# Patient Record
Sex: Female | Born: 1955 | Race: White | Hispanic: No | Marital: Married | Smoking: Never smoker
Health system: Southern US, Community
[De-identification: ages and names within clinical notes are randomized; demographics above are authoritative.]

## PROBLEM LIST (undated history)

## (undated) DIAGNOSIS — R112 Nausea with vomiting, unspecified: Secondary | ICD-10-CM

## (undated) DIAGNOSIS — B059 Measles without complication: Secondary | ICD-10-CM

## (undated) DIAGNOSIS — B029 Zoster without complications: Secondary | ICD-10-CM

## (undated) DIAGNOSIS — K648 Other hemorrhoids: Secondary | ICD-10-CM

## (undated) DIAGNOSIS — B069 Rubella without complication: Secondary | ICD-10-CM

## (undated) DIAGNOSIS — Z124 Encounter for screening for malignant neoplasm of cervix: Principal | ICD-10-CM

## (undated) DIAGNOSIS — D649 Anemia, unspecified: Secondary | ICD-10-CM

## (undated) DIAGNOSIS — F419 Anxiety disorder, unspecified: Secondary | ICD-10-CM

## (undated) DIAGNOSIS — B019 Varicella without complication: Secondary | ICD-10-CM

## (undated) DIAGNOSIS — K222 Esophageal obstruction: Secondary | ICD-10-CM

## (undated) DIAGNOSIS — IMO0002 Reserved for concepts with insufficient information to code with codable children: Secondary | ICD-10-CM

## (undated) DIAGNOSIS — B269 Mumps without complication: Secondary | ICD-10-CM

## (undated) DIAGNOSIS — E079 Disorder of thyroid, unspecified: Secondary | ICD-10-CM

## (undated) DIAGNOSIS — K219 Gastro-esophageal reflux disease without esophagitis: Secondary | ICD-10-CM

## (undated) DIAGNOSIS — Z9889 Other specified postprocedural states: Secondary | ICD-10-CM

## (undated) DIAGNOSIS — K5790 Diverticulosis of intestine, part unspecified, without perforation or abscess without bleeding: Secondary | ICD-10-CM

## (undated) DIAGNOSIS — F418 Other specified anxiety disorders: Secondary | ICD-10-CM

## (undated) DIAGNOSIS — J019 Acute sinusitis, unspecified: Secondary | ICD-10-CM

## (undated) DIAGNOSIS — K449 Diaphragmatic hernia without obstruction or gangrene: Secondary | ICD-10-CM

## (undated) DIAGNOSIS — K635 Polyp of colon: Secondary | ICD-10-CM

## (undated) DIAGNOSIS — T7840XA Allergy, unspecified, initial encounter: Secondary | ICD-10-CM

## (undated) DIAGNOSIS — M1990 Unspecified osteoarthritis, unspecified site: Secondary | ICD-10-CM

## (undated) DIAGNOSIS — M549 Dorsalgia, unspecified: Secondary | ICD-10-CM

## (undated) DIAGNOSIS — E785 Hyperlipidemia, unspecified: Secondary | ICD-10-CM

## (undated) HISTORY — PX: UPPER GASTROINTESTINAL ENDOSCOPY: SHX188

## (undated) HISTORY — DX: Dorsalgia, unspecified: M54.9

## (undated) HISTORY — DX: Diverticulosis of intestine, part unspecified, without perforation or abscess without bleeding: K57.90

## (undated) HISTORY — DX: Hyperlipidemia, unspecified: E78.5

## (undated) HISTORY — DX: Reserved for concepts with insufficient information to code with codable children: IMO0002

## (undated) HISTORY — DX: Anemia, unspecified: D64.9

## (undated) HISTORY — DX: Esophageal obstruction: K22.2

## (undated) HISTORY — DX: Other specified postprocedural states: R11.2

## (undated) HISTORY — DX: Mumps without complication: B26.9

## (undated) HISTORY — DX: Measles without complication: B05.9

## (undated) HISTORY — PX: POLYPECTOMY: SHX149

## (undated) HISTORY — DX: Encounter for screening for malignant neoplasm of cervix: Z12.4

## (undated) HISTORY — DX: Zoster without complications: B02.9

## (undated) HISTORY — DX: Allergy, unspecified, initial encounter: T78.40XA

## (undated) HISTORY — DX: Disorder of thyroid, unspecified: E07.9

## (undated) HISTORY — DX: Varicella without complication: B01.9

## (undated) HISTORY — DX: Other specified anxiety disorders: F41.8

## (undated) HISTORY — DX: Anxiety disorder, unspecified: F41.9

## (undated) HISTORY — DX: Diaphragmatic hernia without obstruction or gangrene: K44.9

## (undated) HISTORY — PX: COLONOSCOPY: SHX174

## (undated) HISTORY — DX: Other specified postprocedural states: Z98.890

## (undated) HISTORY — DX: Polyp of colon: K63.5

## (undated) HISTORY — DX: Gastro-esophageal reflux disease without esophagitis: K21.9

## (undated) HISTORY — PX: WISDOM TOOTH EXTRACTION: SHX21

## (undated) HISTORY — DX: Other hemorrhoids: K64.8

## (undated) HISTORY — PX: VAGINAL HYSTERECTOMY: SUR661

## (undated) HISTORY — DX: Unspecified osteoarthritis, unspecified site: M19.90

## (undated) HISTORY — DX: Rubella without complication: B06.9

## (undated) HISTORY — DX: Acute sinusitis, unspecified: J01.90

---

## 1998-07-05 ENCOUNTER — Emergency Department (HOSPITAL_COMMUNITY): Admission: EM | Admit: 1998-07-05 | Discharge: 1998-07-05 | Payer: Self-pay | Admitting: Emergency Medicine

## 1998-10-27 ENCOUNTER — Inpatient Hospital Stay (HOSPITAL_COMMUNITY): Admission: RE | Admit: 1998-10-27 | Discharge: 1998-10-28 | Payer: Self-pay | Admitting: Obstetrics and Gynecology

## 1998-10-27 ENCOUNTER — Encounter (INDEPENDENT_AMBULATORY_CARE_PROVIDER_SITE_OTHER): Payer: Self-pay | Admitting: Specialist

## 1999-10-13 ENCOUNTER — Encounter: Admission: RE | Admit: 1999-10-13 | Discharge: 1999-10-13 | Payer: Self-pay | Admitting: Obstetrics and Gynecology

## 1999-10-13 ENCOUNTER — Encounter: Payer: Self-pay | Admitting: Obstetrics and Gynecology

## 1999-10-19 ENCOUNTER — Encounter: Payer: Self-pay | Admitting: Obstetrics and Gynecology

## 1999-10-19 ENCOUNTER — Encounter: Admission: RE | Admit: 1999-10-19 | Discharge: 1999-10-19 | Payer: Self-pay | Admitting: Obstetrics and Gynecology

## 2000-11-03 ENCOUNTER — Encounter: Admission: RE | Admit: 2000-11-03 | Discharge: 2000-11-03 | Payer: Self-pay | Admitting: Obstetrics and Gynecology

## 2000-11-03 ENCOUNTER — Encounter: Payer: Self-pay | Admitting: Obstetrics and Gynecology

## 2000-12-06 ENCOUNTER — Other Ambulatory Visit: Admission: RE | Admit: 2000-12-06 | Discharge: 2000-12-06 | Payer: Self-pay | Admitting: Obstetrics and Gynecology

## 2001-05-28 ENCOUNTER — Encounter: Payer: Self-pay | Admitting: Internal Medicine

## 2001-06-19 ENCOUNTER — Encounter: Payer: Self-pay | Admitting: Internal Medicine

## 2001-08-21 ENCOUNTER — Ambulatory Visit (HOSPITAL_COMMUNITY): Admission: RE | Admit: 2001-08-21 | Discharge: 2001-08-21 | Payer: Self-pay | Admitting: Internal Medicine

## 2001-08-21 ENCOUNTER — Encounter: Payer: Self-pay | Admitting: Internal Medicine

## 2002-08-15 ENCOUNTER — Encounter: Payer: Self-pay | Admitting: Internal Medicine

## 2002-12-30 ENCOUNTER — Encounter: Admission: RE | Admit: 2002-12-30 | Discharge: 2002-12-30 | Payer: Self-pay | Admitting: Obstetrics and Gynecology

## 2002-12-31 ENCOUNTER — Encounter: Admission: RE | Admit: 2002-12-31 | Discharge: 2003-03-31 | Payer: Self-pay | Admitting: *Deleted

## 2003-11-26 ENCOUNTER — Ambulatory Visit: Payer: Self-pay | Admitting: Internal Medicine

## 2004-01-15 ENCOUNTER — Encounter: Admission: RE | Admit: 2004-01-15 | Discharge: 2004-01-15 | Payer: Self-pay | Admitting: Obstetrics and Gynecology

## 2004-04-13 ENCOUNTER — Encounter: Admission: RE | Admit: 2004-04-13 | Discharge: 2004-04-13 | Payer: Self-pay | Admitting: Podiatry

## 2004-10-18 ENCOUNTER — Ambulatory Visit: Payer: Self-pay | Admitting: Endocrinology

## 2004-10-18 ENCOUNTER — Ambulatory Visit: Payer: Self-pay | Admitting: Internal Medicine

## 2004-11-12 ENCOUNTER — Ambulatory Visit (HOSPITAL_COMMUNITY): Admission: RE | Admit: 2004-11-12 | Discharge: 2004-11-12 | Payer: Self-pay | Admitting: Internal Medicine

## 2004-11-12 ENCOUNTER — Ambulatory Visit: Payer: Self-pay | Admitting: Internal Medicine

## 2004-12-01 ENCOUNTER — Ambulatory Visit: Payer: Self-pay | Admitting: Endocrinology

## 2004-12-13 ENCOUNTER — Ambulatory Visit (HOSPITAL_COMMUNITY): Admission: RE | Admit: 2004-12-13 | Discharge: 2004-12-13 | Payer: Self-pay | Admitting: Endocrinology

## 2004-12-28 ENCOUNTER — Ambulatory Visit: Payer: Self-pay | Admitting: Endocrinology

## 2005-03-02 ENCOUNTER — Encounter: Admission: RE | Admit: 2005-03-02 | Discharge: 2005-03-02 | Payer: Self-pay | Admitting: Obstetrics and Gynecology

## 2005-12-07 ENCOUNTER — Ambulatory Visit: Payer: Self-pay | Admitting: Endocrinology

## 2005-12-07 LAB — CONVERTED CEMR LAB
ALT: 13 units/L (ref 0–40)
AST: 22 units/L (ref 0–37)
Albumin: 3.8 g/dL (ref 3.5–5.2)
Alkaline Phosphatase: 86 units/L (ref 39–117)
BUN: 12 mg/dL (ref 6–23)
Basophils Absolute: 0 10*3/uL (ref 0.0–0.1)
Basophils Relative: 1 % (ref 0.0–1.0)
Bilirubin Urine: NEGATIVE
CO2: 26 meq/L (ref 19–32)
Calcium: 9.1 mg/dL (ref 8.4–10.5)
Chloride: 101 meq/L (ref 96–112)
Chol/HDL Ratio, serum: 2.7
Cholesterol: 199 mg/dL (ref 0–200)
Creatinine, Ser: 0.8 mg/dL (ref 0.4–1.2)
Eosinophil percent: 6.5 % — ABNORMAL HIGH (ref 0.0–5.0)
GFR calc non Af Amer: 80 mL/min
Glomerular Filtration Rate, Af Am: 97 mL/min/{1.73_m2}
Glucose, Bld: 111 mg/dL — ABNORMAL HIGH (ref 70–99)
HCT: 34.9 % — ABNORMAL LOW (ref 36.0–46.0)
HDL: 73.4 mg/dL (ref >39.0)
Hemoglobin, Urine: NEGATIVE
Hemoglobin: 11.3 g/dL — ABNORMAL LOW (ref 12.0–15.0)
Hgb A1c MFr Bld: 5.4 % (ref 4.6–6.0)
Iron: 49 ug/dL (ref 42–145)
Ketones, ur: NEGATIVE mg/dL
LDL Cholesterol: 106 mg/dL — ABNORMAL HIGH (ref 0–99)
Leukocytes, UA: NEGATIVE
Lymphocytes Relative: 41.7 % (ref 12.0–46.0)
MCHC: 32.4 g/dL (ref 30.0–36.0)
MCV: 88.9 fL (ref 78.0–100.0)
Monocytes Absolute: 0.3 10*3/uL (ref 0.2–0.7)
Monocytes Relative: 6.8 % (ref 3.0–11.0)
Neutro Abs: 2 10*3/uL (ref 1.4–7.7)
Neutrophils Relative %: 44 % (ref 43.0–77.0)
Nitrite: NEGATIVE
Platelets: 274 10*3/uL (ref 150–400)
Potassium: 3.7 meq/L (ref 3.5–5.1)
RBC: 3.93 M/uL (ref 3.87–5.11)
RDW: 11.9 % (ref 11.5–14.6)
Saturation Ratios: 13.7 % — ABNORMAL LOW (ref 20.0–50.0)
Sodium: 135 meq/L (ref 135–145)
Specific Gravity, Urine: <=1.005 (ref 1.000–1.03)
TSH: 1.67 microintl units/mL (ref 0.35–5.50)
Total Bilirubin: 0.5 mg/dL (ref 0.3–1.2)
Total Protein, Urine: NEGATIVE mg/dL
Total Protein: 6.5 g/dL (ref 6.0–8.3)
Transferrin: 254.7 mg/dL (ref >=212.0)
Triglyceride fasting, serum: 98 mg/dL (ref 0–149)
Urine Glucose: NEGATIVE mg/dL
Urobilinogen, UA: 0.2 (ref 0.0–1.0)
VLDL: 20 mg/dL (ref 0–40)
Vitamin B-12: 277 pg/mL (ref 211–911)
WBC: 4.4 10*3/uL — ABNORMAL LOW (ref 4.5–10.5)
pH: 6 (ref 5.0–8.0)

## 2005-12-14 ENCOUNTER — Encounter: Admission: RE | Admit: 2005-12-14 | Discharge: 2005-12-14 | Payer: Self-pay | Admitting: Neurosurgery

## 2006-02-08 ENCOUNTER — Ambulatory Visit: Payer: Self-pay | Admitting: Internal Medicine

## 2006-02-16 ENCOUNTER — Encounter (INDEPENDENT_AMBULATORY_CARE_PROVIDER_SITE_OTHER): Payer: Self-pay | Admitting: *Deleted

## 2006-02-16 ENCOUNTER — Ambulatory Visit: Payer: Self-pay | Admitting: Internal Medicine

## 2006-03-03 ENCOUNTER — Encounter: Admission: RE | Admit: 2006-03-03 | Discharge: 2006-03-03 | Payer: Self-pay | Admitting: Obstetrics and Gynecology

## 2006-10-05 ENCOUNTER — Encounter: Admission: RE | Admit: 2006-10-05 | Discharge: 2006-10-05 | Payer: Self-pay | Admitting: Obstetrics and Gynecology

## 2006-10-21 ENCOUNTER — Encounter: Payer: Self-pay | Admitting: *Deleted

## 2006-10-21 DIAGNOSIS — E039 Hypothyroidism, unspecified: Secondary | ICD-10-CM | POA: Insufficient documentation

## 2006-10-21 DIAGNOSIS — K219 Gastro-esophageal reflux disease without esophagitis: Secondary | ICD-10-CM | POA: Insufficient documentation

## 2006-10-21 DIAGNOSIS — E785 Hyperlipidemia, unspecified: Secondary | ICD-10-CM | POA: Insufficient documentation

## 2007-01-04 ENCOUNTER — Encounter: Payer: Self-pay | Admitting: Endocrinology

## 2007-01-08 LAB — CONVERTED CEMR LAB: Pap Smear: NORMAL

## 2007-03-06 ENCOUNTER — Encounter: Admission: RE | Admit: 2007-03-06 | Discharge: 2007-03-06 | Payer: Self-pay | Admitting: Obstetrics and Gynecology

## 2007-05-04 ENCOUNTER — Ambulatory Visit: Payer: Self-pay | Admitting: Internal Medicine

## 2007-05-04 DIAGNOSIS — H8110 Benign paroxysmal vertigo, unspecified ear: Secondary | ICD-10-CM | POA: Insufficient documentation

## 2007-05-08 ENCOUNTER — Ambulatory Visit: Payer: Self-pay | Admitting: Endocrinology

## 2007-05-09 LAB — CONVERTED CEMR LAB
ALT: 17 units/L (ref 0–35)
AST: 22 units/L (ref 0–37)
Albumin: 3.9 g/dL (ref 3.5–5.2)
Alkaline Phosphatase: 71 units/L (ref 39–117)
BUN: 13 mg/dL (ref 6–23)
Bilirubin, Direct: 0.1 mg/dL (ref 0.0–0.3)
CO2: 31 meq/L (ref 19–32)
Calcium: 9.1 mg/dL (ref 8.4–10.5)
Chloride: 108 meq/L (ref 96–112)
Cholesterol: 188 mg/dL (ref 0–200)
Creatinine, Ser: 0.9 mg/dL (ref 0.4–1.2)
GFR calc Af Amer: 85 mL/min
GFR calc non Af Amer: 70 mL/min
Glucose, Bld: 103 mg/dL — ABNORMAL HIGH (ref 70–99)
HDL: 69.2 mg/dL (ref >39.0)
Hgb A1c MFr Bld: 5.6 % (ref 4.6–6.0)
LDL Cholesterol: 101 mg/dL — ABNORMAL HIGH (ref 0–99)
Potassium: 4.3 meq/L (ref 3.5–5.1)
Sodium: 143 meq/L (ref 135–145)
TSH: 2.43 microintl units/mL (ref 0.35–5.50)
Total Bilirubin: 0.6 mg/dL (ref 0.3–1.2)
Total CHOL/HDL Ratio: 2.7
Total Protein: 6.7 g/dL (ref 6.0–8.3)
Triglycerides: 91 mg/dL (ref 0–149)
VLDL: 18 mg/dL (ref 0–40)

## 2007-05-11 ENCOUNTER — Telehealth (INDEPENDENT_AMBULATORY_CARE_PROVIDER_SITE_OTHER): Payer: Self-pay | Admitting: *Deleted

## 2007-05-15 ENCOUNTER — Ambulatory Visit: Payer: Self-pay | Admitting: Endocrinology

## 2007-05-15 DIAGNOSIS — R739 Hyperglycemia, unspecified: Secondary | ICD-10-CM | POA: Insufficient documentation

## 2007-05-17 DIAGNOSIS — K5730 Diverticulosis of large intestine without perforation or abscess without bleeding: Secondary | ICD-10-CM | POA: Insufficient documentation

## 2007-05-17 DIAGNOSIS — K208 Other esophagitis without bleeding: Secondary | ICD-10-CM | POA: Insufficient documentation

## 2007-05-17 DIAGNOSIS — K319 Disease of stomach and duodenum, unspecified: Secondary | ICD-10-CM | POA: Insufficient documentation

## 2007-05-17 DIAGNOSIS — D649 Anemia, unspecified: Secondary | ICD-10-CM | POA: Insufficient documentation

## 2007-05-28 ENCOUNTER — Ambulatory Visit: Payer: Self-pay | Admitting: Endocrinology

## 2007-05-28 ENCOUNTER — Ambulatory Visit: Payer: Self-pay | Admitting: Internal Medicine

## 2007-05-28 DIAGNOSIS — D126 Benign neoplasm of colon, unspecified: Secondary | ICD-10-CM | POA: Insufficient documentation

## 2007-05-28 DIAGNOSIS — K222 Esophageal obstruction: Secondary | ICD-10-CM | POA: Insufficient documentation

## 2007-05-28 DIAGNOSIS — L299 Pruritus, unspecified: Secondary | ICD-10-CM | POA: Insufficient documentation

## 2007-05-28 DIAGNOSIS — L259 Unspecified contact dermatitis, unspecified cause: Secondary | ICD-10-CM | POA: Insufficient documentation

## 2007-06-06 ENCOUNTER — Telehealth: Payer: Self-pay | Admitting: Internal Medicine

## 2007-07-09 ENCOUNTER — Telehealth: Payer: Self-pay | Admitting: Internal Medicine

## 2008-03-06 ENCOUNTER — Encounter: Admission: RE | Admit: 2008-03-06 | Discharge: 2008-03-06 | Payer: Self-pay | Admitting: Obstetrics and Gynecology

## 2008-07-07 ENCOUNTER — Telehealth: Payer: Self-pay | Admitting: Internal Medicine

## 2008-07-10 ENCOUNTER — Ambulatory Visit: Payer: Self-pay | Admitting: Endocrinology

## 2008-07-10 DIAGNOSIS — Z124 Encounter for screening for malignant neoplasm of cervix: Secondary | ICD-10-CM

## 2008-07-10 DIAGNOSIS — D509 Iron deficiency anemia, unspecified: Secondary | ICD-10-CM | POA: Insufficient documentation

## 2008-07-10 HISTORY — DX: Encounter for screening for malignant neoplasm of cervix: Z12.4

## 2008-07-10 LAB — CONVERTED CEMR LAB
Basophils Absolute: 0 10*3/uL (ref 0.0–0.1)
Basophils Relative: 0.9 % (ref 0.0–3.0)
Cholesterol: 185 mg/dL (ref 0–200)
Eosinophils Absolute: 0.2 10*3/uL (ref 0.0–0.7)
Eosinophils Relative: 3.1 % (ref 0.0–5.0)
Folate: 5.7 ng/mL
HCT: 34.2 % — ABNORMAL LOW (ref 36.0–46.0)
HDL: 83.9 mg/dL (ref >39.00)
Hemoglobin: 11.8 g/dL — ABNORMAL LOW (ref 12.0–15.0)
Hgb A1c MFr Bld: 5.6 % (ref 4.6–6.5)
Iron: 64 ug/dL (ref 42–145)
LDL Cholesterol: 89 mg/dL (ref 0–99)
Lymphocytes Relative: 41.5 % (ref 12.0–46.0)
Lymphs Abs: 2 10*3/uL (ref 0.7–4.0)
MCHC: 34.5 g/dL (ref 30.0–36.0)
MCV: 92.2 fL (ref 78.0–100.0)
Monocytes Absolute: 0.5 10*3/uL (ref 0.1–1.0)
Monocytes Relative: 9.3 % (ref 3.0–12.0)
Neutro Abs: 2.2 10*3/uL (ref 1.4–7.7)
Neutrophils Relative %: 45.2 % (ref 43.0–77.0)
Platelets: 213 10*3/uL (ref 150.0–400.0)
RBC: 3.71 M/uL — ABNORMAL LOW (ref 3.87–5.11)
RDW: 11.9 % (ref 11.5–14.6)
Saturation Ratios: 18.2 % — ABNORMAL LOW (ref 20.0–50.0)
TSH: 1.22 microintl units/mL (ref 0.35–5.50)
Total CHOL/HDL Ratio: 2
Transferrin: 250.7 mg/dL (ref 212.0–360.0)
Triglycerides: 60 mg/dL (ref 0.0–149.0)
VLDL: 12 mg/dL (ref 0.0–40.0)
Vitamin B-12: 433 pg/mL (ref 211–911)
WBC: 4.9 10*3/uL (ref 4.5–10.5)

## 2008-07-11 ENCOUNTER — Ambulatory Visit: Payer: Self-pay | Admitting: Internal Medicine

## 2008-07-11 ENCOUNTER — Encounter: Payer: Self-pay | Admitting: Endocrinology

## 2008-07-14 ENCOUNTER — Ambulatory Visit: Payer: Self-pay | Admitting: Internal Medicine

## 2008-07-16 ENCOUNTER — Ambulatory Visit: Payer: Self-pay | Admitting: Internal Medicine

## 2008-07-16 DIAGNOSIS — R1319 Other dysphagia: Secondary | ICD-10-CM | POA: Insufficient documentation

## 2009-03-03 ENCOUNTER — Ambulatory Visit: Payer: Self-pay | Admitting: Family Medicine

## 2009-03-03 DIAGNOSIS — M949 Disorder of cartilage, unspecified: Secondary | ICD-10-CM

## 2009-03-03 DIAGNOSIS — N951 Menopausal and female climacteric states: Secondary | ICD-10-CM | POA: Insufficient documentation

## 2009-03-03 DIAGNOSIS — M899 Disorder of bone, unspecified: Secondary | ICD-10-CM | POA: Insufficient documentation

## 2009-03-09 ENCOUNTER — Encounter: Admission: RE | Admit: 2009-03-09 | Discharge: 2009-03-09 | Payer: Self-pay | Admitting: Obstetrics and Gynecology

## 2010-01-22 ENCOUNTER — Ambulatory Visit
Admission: RE | Admit: 2010-01-22 | Discharge: 2010-01-22 | Payer: Self-pay | Source: Home / Self Care | Attending: Internal Medicine | Admitting: Internal Medicine

## 2010-01-22 ENCOUNTER — Encounter: Payer: Self-pay | Admitting: Internal Medicine

## 2010-02-08 ENCOUNTER — Telehealth: Payer: Self-pay | Admitting: Internal Medicine

## 2010-02-09 ENCOUNTER — Ambulatory Visit
Admission: RE | Admit: 2010-02-09 | Discharge: 2010-02-09 | Payer: Self-pay | Source: Home / Self Care | Attending: Internal Medicine | Admitting: Internal Medicine

## 2010-02-09 ENCOUNTER — Encounter: Payer: Self-pay | Admitting: Internal Medicine

## 2010-02-16 NOTE — Assessment & Plan Note (Signed)
Summary: NOV hot flashes   Vital Signs:  Patient profile:   55 year old female Height:      61 inches Weight:      116 pounds BMI:     22.00 O2 Sat:      100 % on Room air Temp:     98.1 degrees F oral Pulse rate:   72 / minute BP sitting:   125 / 85  (right arm) Cuff size:   regular  Vitals Entered By: Payton Spark CMA/April (March 03, 2009 9:31 AM)  O2 Flow:  Room air CC: Pt wants to discuss stopping Premarin   Primary Care Provider:  Seymour Bars DO  CC:  Pt wants to discuss stopping Premarin.  History of Present Illness: 55 yo WF presents for NOV.  She is a previously healthy G1P1, followed by Dr Huel Cote for gyn.  She has been on premarin x 8 yrs, just stopped 1 month ago.  She had a TAH w/o BSO in her 40s for fibroids.  No personal hx of abnormal mammogram, blood clots or smoking.  She was still having hot flashes on the premarin and wants to change over to bioidentical hormones.  had salivary testing with Dr Keitha Butte 2 wks ago.  She sees Dr Everardo All for hypothyroidism.  She also has osteopenia.  Current Medications (verified): 1)  Synthroid 50 Mcg  Tabs (Levothyroxine Sodium) .... Take 1 By Mouth Qd 2)  Premarin 0.3 Mg  Tabs (Estrogens Conjugated) .... Take 1 By Mouth Qd 3)  Prilosec 20 Mg Cpdr (Omeprazole) .... Take 1 Tablet By Mouth Two Times A Day 4)  Triamcinolone Acetonide 0.5 % Crea (Triamcinolone Acetonide) .... Apply To Affected Areas Twice Daily As Needed For Itch  Allergies (verified): 1)  ! Compazine  Past History:  Past Medical History: GERD Hyperlipidemia Hypothyroidism-- Endo Dr Everardo All Adenomatous Colon Polyps Esophageal Stricture-- GI Dr Marina Goodell  gyn Dr Senaida Ores  Past Surgical History: Hysterectomy w/o BSO for fibroids  Family History: mother high cholestero, HTN, CVA > age 7. Father died of disected aorta at 76. 2 sisters - one with thyroid dz. GM DM. No fam hx of colon CA.  Social History: Married  to [place] with grown  daughter, in [place]. Never Smoked Alcohol use-no Occupation: Airline pilot Illicit Drug Use - no Daily Caffeine Use: Coffee or tea daily  Exercises 3 x a wk.  Review of Systems       no fevers/sweats/weakness, unexplained wt loss/gain, no change in vision, no difficulty hearing, ringing in ears, no hay fever/allergies, no CP/discomfort, + palpitations, no breast lump/nipple discharge, no cough/wheeze, no blood in stool,no  N/V/D, no nocturia, no leaking urine, no unusual vag bleeding, no vaginal/penile discharge, no muscle/joint pain, no rash, no new/changing mole, no HA, + memory loss, no anxiety, + sleep problem, no depression, no unexplained lumps, + easy bruising/bleeding, no concern with sexual function    Physical Exam  General:  alert, well-developed, well-nourished, and well-hydrated.   Head:  normocephalic and atraumatic.   Mouth:  good dentition and pharynx pink and moist.   Neck:  no masses.   Lungs:  Normal respiratory effort, chest expands symmetrically. Lungs are clear to auscultation, no crackles or wheezes. Heart:  Normal rate and regular rhythm. S1 and S2 normal without gallop, murmur, click, rub or other extra sounds. Msk:  heberdens and bouchards nodes on both hands Pulses:  2+ radial pulses Extremities:  no E/C/C  Skin:  color normal.   Cervical Nodes:  No lymphadenopathy noted Psych:  good eye contact and slightly anxious.     Impression & Recommendations:  Problem # 1:  MENOPAUSE-RELATED VASOMOTOR SYMPTOMS, HOT FLASHES (ICD-627.2) Off Premarin x 1 month.  Low risk for CV dz, blood clots and breast cancer.  Has mammogram scheduled next wk for screening. Will fill compounded bioidential hormones thru [facility] following the salivary testing that she had done.  Call if any problems.  Will  look for improvement in symptoms.  May need to add on SSRI due to some signs of anxiety also. The following medications were removed from the medication list:    Premarin 0.3  Mg Tabs (Estrogens conjugated) .Marland Kitchen... Take 1 by mouth qd  Problem # 2:  OSTEOPENIA (ICD-733.90) Reviewed her 2010 DXA ordered by Dr Everardo All which shows osteopenia.  Risk factors include thin status, caucasion and being postmenopausal.  REviewed recommendations for calcium, Vitamin D, wt bearing exercise and HRT should also help.  06-2010 repeat.    Complete Medication List: 1)  Synthroid 50 Mcg Tabs (Levothyroxine sodium) .... Take 1 by mouth qd 2)  Prilosec 20 Mg Cpdr (Omeprazole) .... Take 1 tablet by mouth two times a day  Patient Instructions: 1)  I will send your RX for bioidentical hormones to [facility] today.  You will get a call to pick up tomorrow. 2)  For osteopenia, I'd recommend Calcium 1000 mg/ day + Vitamin D 1000 International Units/ day.  Calcium total should be measured by dietary intake + supplements. 3)  Return for f/u with fasting labs in 6 mos.

## 2010-02-18 NOTE — Procedures (Signed)
Summary: EGD   EGD  Procedure date:  08/21/2001  Findings:      Location: [facility]  Findings: Stricture:  GERD Patient Name: Holly Kaufman, Holly Kaufman MRN: [id number] Procedure Procedures: Panendoscopy (EGD) CPT: 43235.    with esophageal dilation. CPT: G9296129.  Personnel: Endoscopist: Wilhemina Bonito. Marina Goodell, MD.  Exam Location: Exam performed in Endoscopy Suite.  Patient Consent: Procedure, Alternatives, Risks and Benefits discussed, consent obtained,  Indications  Therapeutics: Reason for exam: Esophageal dilation.  Surveillance of: Erosive esophagitis.  History  Pre-Exam Physical: Performed Aug 21, 2001  Entire physical exam was normal.  Exam Exam Info: Maximum depth of insertion Duodenum, intended Duodenum. Patient position: on left side. Vocal cords visualized. Gastric retroflexion performed. Images taken. ASA Classification: II. Tolerance: excellent.  Sedation Meds: Demerol 70 mg. given IV. Versed 6.5 mg. given IV.  Monitoring: BP and pulse monitoring done. Oximetry used. Supplemental O2 given  Fluoroscopy: Fluoroscopy was used.  Findings HIATAL HERNIA:  STRICTURE / STENOSIS: Stricture in Distal Esophagus.  Constriction: partial. Etiology: benign due to reflux. 32 cm from mouth. Lumen diameter is 16 mm. ICD9: Esophageal Stricture: 530.3. Comment: Esophagitis has healed. No evidence of Barrett's.  - Dilation: Distal Esophagus. Procedure was performed under Fluoroscopy. Wire Guided/Savary (Wilson-Cook) dilator used, Diameter: 18 mm, Minimal Resistance, No Heme present on extraction. 1  total dilators used. Patient tolerance excellent. Outcome: successful.   Assessment Abnormal examination, see findings above.  Diagnoses: 530.3: Esophageal Stricture.  530.81: GERD.   Events  Unplanned Intervention: No unplanned interventions were required.  Unplanned Events: There were no complications. Plans Instructions: Nothing to eat or drink for 2 hrs.  Clear or  full liquids: 2 hrs. Resume previous diet: am.  Patient Education: Patient given standard instructions for: Stenosis / Stricture.  Comments: Continue Nexium DAILY. Disposition: After procedure patient sent to recovery. After recovery patient sent home.  Comments: Call for any problems with reflux or swallowing troubles. Otherwise, follow up office visit in one year.  This report was created from the original endoscopy report, which was reviewed and signed by the above listed endoscopist.   cc:  Huel Cote, MD

## 2010-02-18 NOTE — Procedures (Signed)
Summary: EGD   EGD  Procedure date:  06/19/2001  Findings:      Findings: Esophagitis  Findings: Stricture:  GERD Hiatal Hernia Location: [facility]    EGD  Procedure date:  06/19/2001  Findings:      Findings: Esophagitis  Findings: Stricture:  GERD Hiatal Hernia Location: [facility]   Patient Name: Holly Kaufman, Holly Kaufman MRN: [id number] Procedure Procedures: Panendoscopy (EGD) CPT: 43235.  Personnel: Endoscopist: Wilhemina Bonito. Marina Goodell, MD.  Referred By: Huel Cote, MD.  Exam Location: Exam performed in Outpatient Clinic. Outpatient  Patient Consent: Procedure, Alternatives, Risks and Benefits discussed, consent obtained, from patient. Consent was obtained by the RN.  Indications Symptoms: Reflux symptoms occurring 3-6 times/wk.  History  Pre-Exam Physical: Performed Jun 19, 2001  Entire physical exam was normal.  Exam Exam Info: Maximum depth of insertion Duodenum, intended Duodenum. Patient position: on left side. Vocal cords visualized. Gastric retroflexion performed. Images taken. ASA Classification: II. Tolerance: excellent.  Sedation Meds: Patient assessed and found to be appropriate for moderate (conscious) sedation. Residual sedation present from prior procedure today.  Monitoring: BP and pulse monitoring done. Oximetry used. Supplemental O2 given  Findings - ESOPHAGEAL INFLAMMATION: as a result of reflux. Severity is moderate, erosions present.  Proximal margin 26 cm from mouth,  distal margin 30 cm. Length of inflammation: 4 cm. Edema present. [place] Classification: Grade C. ICD9: Esophagitis, Reflux: 530. 11.  STRICTURE / STENOSIS: Stricture in Distal Esophagus.  Constriction: partial. Etiology: benign due to reflux. 35 cm from mouth. ICD9: Esophageal Stricture: 530.3.  HIATAL HERNIA: Regular, 5 cms. in length. ICD9: Hernia, Hiatal: 553.3.  Assessment Abnormal examination, see findings above.  Diagnoses: 530.11:  Esophagitis, Reflux.  530.3: Esophageal Stricture.  553.3: Hernia, Hiatal.  530.81: GERD.   Events  Unplanned Intervention: No unplanned interventions were required.  Unplanned Events: There were no complications. Plans Medication(s): PPI: Esomeprazole/Nexium 40 mg QD, starting Jun 19, 2001   Disposition: After procedure patient sent to recovery. After recovery patient sent home.  Comments: EGD with Savary dilation in 8 weeks (Dr Lamar Sprinkles nurse, Elnita Maxwell to arrange)  This report was created from the original endoscopy report, which was reviewed and signed by the above listed endoscopist.   cc:  Huel Cote, MD

## 2010-02-18 NOTE — Progress Notes (Signed)
Summary: [facility]  [facility]   Imported By: Lester [place] 01/25/2010 08:01:50  _____________________________________________________________________  External Attachment:    Type:   Image     Comment:   External Document

## 2010-02-18 NOTE — Progress Notes (Signed)
Summary: [facility]  [facility]   Imported By: Lester [place] 01/25/2010 08:03:08  _____________________________________________________________________  External Attachment:    Type:   Image     Comment:   External Document

## 2010-02-18 NOTE — Procedures (Signed)
Summary: Colonoscopy   Colonoscopy  Procedure date:  06/19/2001  Findings:      Location:  [facility].  Results: Diverticulosis.         Procedures Next Due Date:    Colonoscopy: 06/2006  Colonoscopy  Procedure date:  06/19/2001  Findings:      Location:  [facility].  Results: Diverticulosis.         Procedures Next Due Date:    Colonoscopy: 06/2006 Patient Name: Holly Kaufman, Holly Kaufman MRN: [id number] Procedure Procedures: Colonoscopy CPT: 54098.  Personnel: Endoscopist: Wilhemina Bonito. Marina Goodell, MD.  Referred By: Huel Cote, MD.  Exam Location: Exam performed in Outpatient Clinic. Outpatient  Patient Consent: Procedure, Alternatives, Risks and Benefits discussed, consent obtained, from patient. Consent was obtained by the RN.  Indications  Increased Risk Screening: For family history of colorectal neoplasia, in  Family History of Polyps.  History  Pre-Exam Physical: Performed Jun 19, 2001. Entire physical exam was normal.  Exam Exam: Extent of exam reached: Cecum, extent intended: Cecum.  The cecum was identified by appendiceal orifice and IC valve. Patient position: left side to back. Colon retroflexion performed. Images taken. ASA Classification: II. Tolerance: excellent.  Monitoring: Pulse and BP monitoring, Oximetry used. Supplemental O2 given.  Colon Prep Used Golytely for colon prep. Prep results: excellent.  Sedation Meds: Patient assessed and found to be appropriate for moderate (conscious) sedation. Fentanyl 100 mcg. given IV. Versed 10 mg. given IV.  Findings NORMAL EXAM: Cecum to Rectum.  - DIVERTICULOSIS: Sigmoid Colon. ICD9: Diverticulosis, Colon: 562.10.   Assessment Abnormal examination, see findings above.  Diagnoses: 562.10: Diverticulosis, Colon.   Events  Unplanned Interventions: No intervention was required.  Unplanned Events: There were no complications. Plans Disposition: After procedure patient sent  to recovery. After recovery patient sent home.  Scheduling/Referral: Colonoscopy, to Wilhemina Bonito. Marina Goodell, MD, in 5 years for repeat screening,    This report was created from the original endoscopy report, which was reviewed and signed by the above listed endoscopist.   cc: Huel Cote, MD

## 2010-02-18 NOTE — Progress Notes (Signed)
Summary: [facility]  [facility]   Imported By: Lester [place] 01/25/2010 08:00:03  _____________________________________________________________________  External Attachment:    Type:   Image     Comment:   External Document

## 2010-02-18 NOTE — Procedures (Addendum)
Summary: Upper Endoscopy  Patient: [name] Note: All result statuses are Final unless otherwise noted.  Tests: (1) Upper Endoscopy (EGD)   EGD Upper Endoscopy       DONE     [facility]     [address]     [place], [place]  [address]           ENDOSCOPY PROCEDURE REPORT           PATIENT:  Holly Kaufman, Holly Kaufman  MR#:  [id number]     BIRTHDATE:  [birth year], 54 yrs. old  GENDER:  female           ENDOSCOPIST:  Wilhemina Bonito. Eda Keys, MD     Referred by:  Office           PROCEDURE DATE:  02/09/2010     PROCEDURE:  EGD, diagnostic 43235,     Maloney Dilation of Esophagus -     50F     ASA CLASS:  Class II     INDICATIONS:  dysphagia, dilation of esophageal stricture           MEDICATIONS:   Fentanyl 50 mcg IV, Versed 5 mg IV     TOPICAL ANESTHETIC:  Exactacain Spray           DESCRIPTION OF PROCEDURE:   After the risks benefits and     alternatives of the procedure were thoroughly explained, informed     consent was obtained.  The [id number] endoscope was     introduced through the mouth and advanced to the second portion of     the duodenum, without limitations.  The instrument was slowly     withdrawn as the mucosa was fully examined.     <<PROCEDUREIMAGES>>           A benign 15mm ring-like stricture was found in the distal     esophagus.   There were multiple small benign fundic gland type     polyps identified in the fundus / body of the stomach.  Otherwise     the examination was normal to D2.    Retroflexed views revealed a     hiatal hernia.    The scope was then withdrawn from the patient     and the procedure completed.           THERAPY: 50F MALONEY DILATOR PASSED W/O RESISTANCE OR HEME.     TOLERATED WELL           COMPLICATIONS:  None           ENDOSCOPIC IMPRESSION:     1) Stricture in the distal esophagus - S/P DILATION 50F     2) Hiatal hernia     3) Polyps, multiple in the fundus / body     4) Otherwise normal examination     5) Gerd          RECOMMENDATIONS:     1) Clear liquids until 10:30 am, then soft foods rest of day.     Resume prior diet tomorrow.     2) Continue Prilosec           ______________________________     Wilhemina Bonito. Eda Keys, MD           CC:  The Patient; Dr Christell Constant [facility])           n.     eSIGNED:   Wilhemina Bonito. Eda Keys at 02/09/2010 08:42 AM  Daje, Stark, [id number]  Note: An exclamation mark (!) indicates a result that was not dispersed into the flowsheet. Document Creation Date: 02/09/2010 8:42 AM _______________________________________________________________________  (1) Order result status: Final Collection or observation date-time: 02/09/2010 08:31 Requested date-time:  Receipt date-time:  Reported date-time:  Referring Physician:   Ordering Physician: Fransico Setters [phone]) Specimen Source:  Source: Launa Grill Order Number: [id number] Lab site:

## 2010-02-18 NOTE — Progress Notes (Signed)
Summary: [facility]  [facility]   Imported By: Lester [place] 01/25/2010 08:04:22  _____________________________________________________________________  External Attachment:    Type:   Image     Comment:   External Document

## 2010-02-18 NOTE — Letter (Signed)
Summary: EGD Instructions  [facility]  [address] [place], [place] [address]   Phone: [phone]  Fax: [phone]       Holly Kaufman    Oct 05, 1955    MRN: [id number]       Procedure Day /Date:TUESDAY, 02/09/10     Arrival Time: 7:30 AM     Procedure Time:8:00 AM     Location of Procedure:                    [facility] (4th Floor)   PREPARATION FOR ENDOSCOPY   On TUESDAY, 02/09/10 THE DAY OF THE PROCEDURE:  1.   No solid foods, milk or milk products are allowed after midnight the night before your procedure.  2.   Do not drink anything colored red or purple.  Avoid juices with pulp.  No orange juice.  3.  You may drink clear liquids until 6:00 AM, which is 2 hours before your procedure.                                                                                                CLEAR LIQUIDS INCLUDE: Water Jello Ice Popsicles Tea (sugar ok, no milk/cream) Powdered fruit flavored drinks Coffee (sugar ok, no milk/cream) Gatorade Juice: apple, white grape, white cranberry  Lemonade Clear bullion, consomm, broth Carbonated beverages (any kind) Strained chicken noodle soup Hard Candy   MEDICATION INSTRUCTIONS  Unless otherwise instructed, you should take regular prescription medications with a small sip of water as early as possible the morning of your procedure.         OTHER INSTRUCTIONS  You will need a responsible adult at least 56 years of age to accompany you and drive you home.   This person must remain in the waiting room during your procedure.  Wear loose fitting clothing that is easily removed.  Leave jewelry and other valuables at home.  However, you may wish to bring a book to read or an iPod/MP3 player to listen to music as you wait for your procedure to start.  Remove all body piercing jewelry and leave at home.  Total time from sign-in until discharge is approximately 2-3 hours.  You should go home  directly after your procedure and rest.  You can resume normal activities the day after your procedure.  The day of your procedure you should not:   Drive   Make legal decisions   Operate machinery   Drink alcohol   Return to work  You will receive specific instructions about eating, activities and medications before you leave.    The above instructions have been reviewed and explained to me by   _______________________    I fully understand and can verbalize these instructions _____________________________ Date _________

## 2010-02-18 NOTE — Assessment & Plan Note (Signed)
Summary: DYSPHAGIA   History of Present Illness Visit Type: Follow-up Visit Primary GI MD: Yancey Flemings MD Primary Provider: Moore([facility]) Requesting Provider: n/a Chief Complaint: dysphaia, patient feels that its time dilation; also needs med refills History of Present Illness:   55 year old female with hypothyroidism, hyperlipidemia, adenomatous colon polyps, and gastroesophageal reflux disease complicated by erosive esophagitis and peptic stricture. She was last evaluated in the office June, 2010. She presents today with a chief complaint of recurrent dysphagia. Also request medication refill and has questions regarding long term PPI use and possible effects on bone density. Her last colonoscopy was in January of 2008. She will be due for followup in January 2013. No new GI symptoms. Chronic stable constipation. Her last upper endoscopy was in January 2008. Dilated to 54 French Maloney dilator. Recurrent intermittent solid food dysphagia over the past year. Somewhat worsening. No reflux symptoms on daily omeprazole 40 mg. No other complaints or issues.   GI Review of Systems    Reports acid reflux, dysphagia with solids, and  heartburn.      Denies abdominal pain, belching, bloating, chest pain, dysphagia with liquids, loss of appetite, nausea, vomiting, vomiting blood, weight loss, and  weight gain.      Reports constipation.     Denies anal fissure, black tarry stools, change in bowel habit, diarrhea, diverticulosis, fecal incontinence, heme positive stool, hemorrhoids, irritable bowel syndrome, jaundice, light color stool, liver problems, rectal bleeding, and  rectal pain.    Current Medications (verified): 1)  Synthroid 50 Mcg  Tabs (Levothyroxine Sodium) .... Take 1 By Mouth Qd 2)  Prilosec 20 Mg Cpdr (Omeprazole) .... Take 1 Tablet By Mouth Two Times A Day 3)  Celexa 10 Mg Tabs (Citalopram Hydrobromide) .... As Needed  Allergies (verified): 1)  !  Compazine  Past History:  Past Medical History: Reviewed history from 03/03/2009 and no changes required. GERD Hyperlipidemia Hypothyroidism-- Endo Dr Everardo All Adenomatous Colon Polyps Esophageal Stricture-- GI Dr Marina Goodell  gyn Dr Senaida Ores  Past Surgical History: Reviewed history from 03/03/2009 and no changes required. Hysterectomy w/o BSO for fibroids  Family History: Reviewed history from 03/03/2009 and no changes required. mother high cholestero, HTN, CVA > age 17. Father died of disected aorta at 40. 2 sisters - one with thyroid dz. GM DM. No fam hx of colon CA.  Social History: Reviewed history from 03/03/2009 and no changes required. Married  to [place]ier with grown daughter, in [place]. Never Smoked Alcohol use-no Occupation: Airline pilot Illicit Drug Use - no Daily Caffeine Use: Coffee or tea daily  Exercises 3 x a wk.  Review of Systems       The patient complains of sleeping problems.  The patient denies allergy/sinus, anemia, anxiety-new, arthritis/joint pain, back pain, blood in urine, breast changes/lumps, change in vision, confusion, cough, coughing up blood, depression-new, fainting, fatigue, fever, headaches-new, hearing problems, heart murmur, heart rhythm changes, itching, menstrual pain, muscle pains/cramps, night sweats, nosebleeds, pregnancy symptoms, shortness of breath, skin rash, sore throat, swelling of feet/legs, swollen lymph glands, thirst - excessive , urination - excessive , urination changes/pain, urine leakage, vision changes, and voice change.    Vital Signs:  Patient profile:   55 year old female Height:      61 inches Weight:      123.38 pounds BMI:     23.40 Pulse rate:   80 / minute Pulse rhythm:   regular BP sitting:   100 / 62  (left arm) Cuff size:   regular  Vitals Entered By: June McMurray CMA Duncan Dull) (January 22, 2010 8:32 AM)  Physical Exam  General:  Well developed, well nourished, no acute distress. Head:  Normocephalic  and atraumatic. Eyes:  PERRLA, no icterus. Ears:  Normal auditory acuity. Nose:  No deformity, discharge,  or lesions. Mouth:  No deformity or lesions, dentition normal. Neck:  Supple; no masses or thyromegaly. Lungs:  Clear throughout to auscultation. Heart:  Regular rate and rhythm; no murmurs, rubs,  or bruits. Abdomen:  Soft, nontender and nondistended. No masses, hepatosplenomegaly or hernias noted. Normal bowel sounds. Msk:  Symmetrical with no gross deformities. Normal posture. Pulses:  Normal pulses noted. Extremities:  No clubbing, cyanosis, edema or deformities noted. Neurologic:  Alert and  oriented x4;  grossly normal neurologically. Skin:  Intact without significant lesions or rashes. Psych:  Alert and cooperative. Normal mood and affect.   Impression & Recommendations:  Problem # 1:  GERD (ICD-530.81) GERD complicated by erosive esophagitis(530.11) and peptic stricture (530.3). Now with recurrent dysphagia (787.20) likely due to recurrent peptic stricture.  Plan: #1. Continue omeprazole 40 mg daily.prescription refilled #2. Schedule upper endoscopy with esophageal dilation. The nature of the procedure as well as the risks, benefits, and alternatives were reviewed. She understood and agreed to proceed #3. I discussed with her the available information regarding chronic PPI use and possible negative facts on bones with increased risk for fractures. We discussed the appropriateness of her PPI use given the complicated nature of her GERD. We also discussed the importance of monitoring her bone density (with her primary provider) and treatment of abnormalities or deficiencies (with her primary provider) irrespective of PPI use. I remind her that she is at high risk for osteoporosis being a thin small boned and white female.  Problem # 2:  DYSPHAGIA (ICD-787.29) see above  Problem # 3:  ESOPHAGEAL STRICTURE (ICD-530.3) see above  Problem # 4:  ADENOMATOUS COLON POLYPS  (ICD-211.3) due for followup in one year.  Other Orders: EGD SAV (EGD SAV)  Patient Instructions: 1)  EGD Dil [facility] 02/09/10 8:00 am arrive at 7:30 am 2)  Upper Endoscopy brochure given.  3)  Upper Endoscopy with Dilatation brochure given.  4)  Omeprazole 40 refilled #90  5)  Copy sent to :Dr. Christell Constant [facility]) 6)  The medication list was reviewed and reconciled.  All changed / newly prescribed medications were explained.  A complete medication list was provided to the patient / caregiver. Prescriptions: OMEPRAZOLE 40 MG CPDR (OMEPRAZOLE) 1 by mouth once daily  #90 x 4   Entered by:   Milford Cage NCMA   Authorized by:   Hilarie Fredrickson MD   Signed by:   Milford Cage NCMA on 01/22/2010   Method used:   Print then Give to Patient   RxID:   [id number]

## 2010-02-18 NOTE — Progress Notes (Signed)
Summary: Procedure ?s  Phone Note Call from Patient Call back at [phone]   Caller: Patient Call For: Dr. Marina Goodell Reason for Call: Talk to Nurse Summary of Call: Pt has questions about procedure in the morning Initial call taken by: [place] Johnson,  February 08, 2010 8:10 AM  Follow-up for Phone Call        Pt has had cold s/s since friday and took ibuprofen 1x friday and none since but has been taking alka seltzer cold and sinus as directed and wanted to make sure this wasnt going to hinder her from having her procedure tomorrow am.......Marland KitchenPt instructed this med was fine to continue but just no more ibuprofen. Pt returned verbal understanding of instructions Follow-up by: Joylene John RN,  February 08, 2010 8:45 AM

## 2010-02-25 ENCOUNTER — Other Ambulatory Visit (HOSPITAL_BASED_OUTPATIENT_CLINIC_OR_DEPARTMENT_OTHER): Payer: Self-pay | Admitting: Obstetrics and Gynecology

## 2010-02-25 DIAGNOSIS — Z139 Encounter for screening, unspecified: Secondary | ICD-10-CM

## 2010-03-01 ENCOUNTER — Ambulatory Visit (HOSPITAL_BASED_OUTPATIENT_CLINIC_OR_DEPARTMENT_OTHER)
Admission: RE | Admit: 2010-03-01 | Discharge: 2010-03-01 | Disposition: A | Discharge status: Home / Self Care | Payer: 59 | Source: Ambulatory Visit | Attending: Obstetrics and Gynecology | Admitting: Obstetrics and Gynecology

## 2010-03-01 DIAGNOSIS — Z139 Encounter for screening, unspecified: Secondary | ICD-10-CM

## 2010-03-10 ENCOUNTER — Ambulatory Visit (HOSPITAL_BASED_OUTPATIENT_CLINIC_OR_DEPARTMENT_OTHER)
Admission: RE | Admit: 2010-03-10 | Discharge: 2010-03-10 | Disposition: A | Discharge status: Home / Self Care | Payer: 59 | Source: Ambulatory Visit | Attending: Obstetrics and Gynecology | Admitting: Obstetrics and Gynecology

## 2010-03-10 DIAGNOSIS — Z139 Encounter for screening, unspecified: Secondary | ICD-10-CM

## 2010-03-10 DIAGNOSIS — Z1231 Encounter for screening mammogram for malignant neoplasm of breast: Secondary | ICD-10-CM | POA: Insufficient documentation

## 2010-03-12 ENCOUNTER — Ambulatory Visit (HOSPITAL_BASED_OUTPATIENT_CLINIC_OR_DEPARTMENT_OTHER): Payer: Self-pay

## 2010-06-04 NOTE — Assessment & Plan Note (Signed)
[facility]                         GASTROENTEROLOGY OFFICE NOTE   DORE, OQUIN                      MRN:          [id number]  DATE:02/08/2006                            DOB:          13-Jan-1956    Holly Kaufman presents today for follow-up.  She is a 55 year old with a history  of gastroesophageal reflux disease complicated by erosive esophagitis  and peptic stricture.  She has required esophageal dilation on several  occasions in the past.  She also has a family history of colon cancer,  colon polyps, and underwent her initial surveillance colonoscopy in June  of 2003.  She presents today regarding her reflux disease as well as the  need for follow-up colorectal neoplasia screening.  In terms of her  reflux, she was changed from Nexium 40 mg daily which controlled her  reflux symptoms well to Prilosec 20 mg daily.  This due to insurance  formulary preference.  On 20 mg a day of Prilosec, she is experiencing  significant breakthrough heartburn and indigestion several times per  week.  She has also had some recurrent problems with dysphagia and  states that she needs to chew very carefully.  No vomiting, abdominal  pain, or GI bleeding.  Her last colonoscopy in June of 2003 revealed  diverticulosis.  She has had no significant interval medical problems  since her last office visit in October of 2006.   CURRENT MEDICATIONS:  1. Synthroid 50 mcg daily.  2. Premarin.  3. Prilosec 20 mg daily.   ALLERGIES:  COMPAZINE.   PHYSICAL EXAMINATION:  GENERAL:  Well-appearing female in no acute  distress.  VITAL SIGNS:  Blood pressure 124/68, heart rate is 66 and regular,  weight is 138.2 pounds.  HEENT:  Sclerae anicteric, conjunctivae pink, oral mucosa intact with no  adenopathy.  LUNGS:  Clear.  HEART:  Regular.  ABDOMEN:  Soft without tenderness, mass, or hernia.  Good bowel sounds  heard.   IMPRESSION:  1. Gastroesophageal reflux disease with a  history of erosive      esophagitis and peptic stricture, currently experiencing      significant breakthrough symptoms on once daily Prilosec.  As well,      recurrent dysphagia, likely due to recurrent peptic stricture.  2. Family history of colon cancer and colon polyps, due this year for      follow-up screening. She is interested in having this completed      prior to starting her new job next month.   RECOMMENDATIONS:  Colonoscopy with polypectomy if necessary and upper  endoscopy with esophageal dilation.  Ashby Dawes of the procedures as well as  risks, benefits, and alternatives  were discussed in detail. She understood and agreed to proceed. Also,  increase Prilosec from  20 mg daily to 40 mg daily to help with breakthrough symptoms.  Continued adherence to reflux precautions.     Wilhemina Bonito. Marina Goodell, MD  Electronically Signed    JNP/MedQ  DD: 02/08/2006  DT: 02/08/2006  Job #: [id number]

## 2011-01-20 ENCOUNTER — Ambulatory Visit (INDEPENDENT_AMBULATORY_CARE_PROVIDER_SITE_OTHER): Payer: 59

## 2011-01-20 DIAGNOSIS — J309 Allergic rhinitis, unspecified: Secondary | ICD-10-CM

## 2011-01-20 DIAGNOSIS — H6980 Other specified disorders of Eustachian tube, unspecified ear: Secondary | ICD-10-CM

## 2011-01-24 ENCOUNTER — Encounter: Payer: Self-pay | Admitting: Internal Medicine

## 2011-02-01 ENCOUNTER — Other Ambulatory Visit: Payer: Self-pay | Admitting: Internal Medicine

## 2011-03-03 ENCOUNTER — Encounter: Payer: Self-pay | Admitting: Internal Medicine

## 2011-03-03 ENCOUNTER — Ambulatory Visit (AMBULATORY_SURGERY_CENTER): Payer: 59 | Admitting: *Deleted

## 2011-03-03 ENCOUNTER — Telehealth: Payer: Self-pay | Admitting: *Deleted

## 2011-03-03 DIAGNOSIS — Z86010 Personal history of colonic polyps: Secondary | ICD-10-CM

## 2011-03-03 DIAGNOSIS — Z1211 Encounter for screening for malignant neoplasm of colon: Secondary | ICD-10-CM

## 2011-03-03 MED ORDER — PEG-KCL-NACL-NASULF-NA ASC-C 100 G PO SOLR: ORAL | Status: DC

## 2011-03-03 NOTE — Telephone Encounter (Signed)
Pt notified of Dr. Lamar Sprinkles ok to do ECL.  Pt told to come into [facility] at 2:30, to start her prep 30 minutes early and not drink after 1:30 p.m.  Understanding voiced

## 2011-03-03 NOTE — Telephone Encounter (Signed)
Ok to set up as a double (530.3 and 787.20 codes for EGD). Thanks

## 2011-03-03 NOTE — Telephone Encounter (Signed)
Dr. Marina Goodell, This patient had a previsit for her colonoscopy on 03-16-11.  She had an EGD in 2008.  She had a dilation at that time.  She states that she "occasionally has trouble swallowing" and wanted to know if you would do a double procedure the day of her colonoscopy.  I put her chart in your office.    Thank you, Baxter Hire

## 2011-03-03 NOTE — Progress Notes (Signed)
See phone note to Dr. Marina Goodell.  No allergy to eggs or soy products

## 2011-03-16 ENCOUNTER — Ambulatory Visit (AMBULATORY_SURGERY_CENTER): Payer: 59 | Admitting: Internal Medicine

## 2011-03-16 ENCOUNTER — Encounter: Payer: Self-pay | Admitting: Internal Medicine

## 2011-03-16 ENCOUNTER — Other Ambulatory Visit: Payer: 59 | Admitting: Internal Medicine

## 2011-03-16 VITALS — BP 125/78 | HR 73 | Temp 96.4°F | Resp 20 | Ht 61.2 in | Wt 129.0 lb

## 2011-03-16 DIAGNOSIS — K222 Esophageal obstruction: Secondary | ICD-10-CM

## 2011-03-16 DIAGNOSIS — K219 Gastro-esophageal reflux disease without esophagitis: Secondary | ICD-10-CM

## 2011-03-16 DIAGNOSIS — Z86010 Personal history of colonic polyps: Secondary | ICD-10-CM

## 2011-03-16 DIAGNOSIS — R1319 Other dysphagia: Secondary | ICD-10-CM

## 2011-03-16 DIAGNOSIS — Z1211 Encounter for screening for malignant neoplasm of colon: Secondary | ICD-10-CM

## 2011-03-16 DIAGNOSIS — D126 Benign neoplasm of colon, unspecified: Secondary | ICD-10-CM

## 2011-03-16 MED ORDER — SODIUM CHLORIDE 0.9 % IV SOLN: 500.0000 mL | INTRAVENOUS | Status: DC

## 2011-03-16 NOTE — Op Note (Signed)
[facility] [address] [place], [place]  [address]  ENDOSCOPY PROCEDURE REPORT  PATIENT:  Holly, Kaufman  MR#:  [id number] BIRTHDATE:  April 25, 1955, 55 yrs. old  GENDER:  female  ENDOSCOPIST:  Wilhemina Bonito. Eda Keys, MD Referred by:  .Direct  PROCEDURE DATE:  03/16/2011 PROCEDURE:  EGD, diagnostic 43235, Maloney Dilation of Esophagus - 103F ASA CLASS:  Class II INDICATIONS:  dysphagia, dilation of esophageal stricture, GERD  MEDICATIONS:   MAC sedation, administered by CRNA, propofol (Diprivan) 100 mg IV TOPICAL ANESTHETIC:  none  DESCRIPTION OF PROCEDURE:   After the risks benefits and alternatives of the procedure were thoroughly explained, informed consent was obtained.  The LB [id number] endoscope was introduced through the mouth and advanced to the second portion of the duodenum, without limitations.  The instrument was slowly withdrawn as the mucosa was fully examined. <<PROCEDUREIMAGES>>  A 15mm ring-like stricture was found in the distal esophagus. Otherwise the examination  of the esophagus, stomach and duodenum was normal.    Retroflexed views revealed a hiatal hernia.    The scope was then withdrawn from the patient and the procedure completed.  THERAPY: 103F MALONEY DILATOR PASSED W/O RESISTANCE OR HEME. TOLERATED WELL.  COMPLICATIONS:  None  ENDOSCOPIC IMPRESSION: 1) Stricture in the distal esophagus - S/P DILATION 2) Otherwise normal examination 3) A hiatal hernia 4) GERD  RECOMMENDATIONS: 1) Clear liquids until 6PM, then soft foods rest of day. Resume prior diet tomorrow. 2) Continue OMEPRAZOLE DAILY  ______________________________ Wilhemina Bonito. Eda Keys, MD  CC:  Holly Bars, DO; The Patient  n. eSIGNED:   Maysel Mccolm N. Eda Keys at 03/16/2011 04:27 PM  Lyda Kalata, [phone]

## 2011-03-16 NOTE — Progress Notes (Signed)
Patient did not have preoperative order for IV antibiotic SSI prophylaxis. (G8918)  Patient did not experience any of the following events: a burn prior to discharge; a fall within the facility; wrong site/side/patient/procedure/implant event; or a hospital transfer or hospital admission upon discharge from the facility. (G8907)  

## 2011-03-16 NOTE — Op Note (Signed)
[facility] [address] [place], [place]  [address]  COLONOSCOPY PROCEDURE REPORT  PATIENT:  Holly Kaufman, Holly Kaufman  MR#:  [id number] BIRTHDATE:  23-Sep-1955, 55 yrs. old  GENDER:  female ENDOSCOPIST:  Wilhemina Bonito. Eda Keys, MD REF. BY:  Surveillance Program Recall, PROCEDURE DATE:  03/16/2011 PROCEDURE:  Colonoscopy with snare polypectomy X 2 ASA CLASS:  Class II INDICATIONS:  history of pre-cancerous (adenomatous) colon polyps, family history of colon cancer, surveillance and high-risk screening ; INDEX 2003; F/U 2008 (/ TA) MEDICATIONS:   MAC sedation, administered by CRNA, propofol (Diprivan) 250 mg IV  DESCRIPTION OF PROCEDURE:   After the risks benefits and alternatives of the procedure were thoroughly explained, informed consent was obtained.  Digital rectal exam was performed and revealed no abnormalities.   The LB 180AL [id number] endoscope was introduced through the anus and advanced to the cecum, which was identified by both the appendix and ileocecal valve, without limitations.  The quality of the prep was good, using MoviPrep. The instrument was then slowly withdrawn as the colon was fully examined. <<PROCEDUREIMAGES>>  FINDINGS:  Two 4mm polyps were found in the mid transverse colon and snared without cautery. Retrieval was successful.  Moderate diverticulosis was found in the sigmoid colon.  Otherwise normal colonoscopy without other polyps, masses, vascular ectasias, or inflammatory changes.   Retroflexed views in the rectum revealed internal hemorrhoids.    The time to cecum = 4:50  minutes. The scope was then withdrawn in  13  minutes from the cecum and the procedure completed.  COMPLICATIONS:  None  ENDOSCOPIC IMPRESSION: 1) Two polyps in the mid transverse colon 2) Moderate diverticulosis in the sigmoid colon 3) Otherwise normal colonoscopy 4) Internal hemorrhoids  RECOMMENDATIONS: 1) Follow up colonoscopy in 5 years 2) Upper endoscopy  today  ______________________________ Wilhemina Bonito. Eda Keys, MD  CC:  Seymour Bars, DO;  The Patient  n. eSIGNED:   Naji Mehringer N. Eda Keys at 03/16/2011 04:15 PM  Lyda Kalata, [phone]

## 2011-03-16 NOTE — Patient Instructions (Signed)

## 2011-03-17 ENCOUNTER — Telehealth: Payer: Self-pay

## 2011-03-17 NOTE — Telephone Encounter (Signed)
  Follow up Call-  Call back number 03/16/2011  Post procedure Call Back phone  # [phone]  Permission to leave phone message Yes     Patient questions:  Do you have a fever, pain , or abdominal swelling? no Pain Score  0 *  Have you tolerated food without any problems? yes  Have you been able to return to your normal activities? yes  Do you have any questions about your discharge instructions: Diet   no Medications  no Follow up visit  no  Do you have questions or concerns about your Care? no  Actions: * If pain score is 4 or above: No action needed, pain <4.

## 2011-03-22 ENCOUNTER — Encounter: Payer: Self-pay | Admitting: Internal Medicine

## 2011-03-23 ENCOUNTER — Ambulatory Visit (HOSPITAL_BASED_OUTPATIENT_CLINIC_OR_DEPARTMENT_OTHER)
Admission: RE | Admit: 2011-03-23 | Discharge: 2011-03-23 | Disposition: A | Discharge status: Home / Self Care | Payer: 59 | Source: Ambulatory Visit | Attending: Obstetrics & Gynecology | Admitting: Obstetrics & Gynecology

## 2011-03-23 ENCOUNTER — Other Ambulatory Visit (HOSPITAL_BASED_OUTPATIENT_CLINIC_OR_DEPARTMENT_OTHER): Payer: Self-pay | Admitting: Obstetrics & Gynecology

## 2011-03-23 ENCOUNTER — Telehealth: Payer: Self-pay | Admitting: Internal Medicine

## 2011-03-23 DIAGNOSIS — Z1231 Encounter for screening mammogram for malignant neoplasm of breast: Secondary | ICD-10-CM | POA: Insufficient documentation

## 2011-03-23 LAB — HM COLONOSCOPY

## 2011-03-23 NOTE — Telephone Encounter (Signed)
Pt called wanting the results from her colon. Spoke with pt and reviewed results letter with her that was mailed out 03/22/11. Pt verbalized understanding.

## 2011-12-22 ENCOUNTER — Ambulatory Visit (INDEPENDENT_AMBULATORY_CARE_PROVIDER_SITE_OTHER): Payer: 59 | Admitting: Physician Assistant

## 2011-12-22 ENCOUNTER — Ambulatory Visit: Payer: 59 | Admitting: Family Medicine

## 2011-12-22 VITALS — BP 124/79 | HR 69 | Temp 98.0°F | Resp 16 | Ht 61.5 in | Wt 132.0 lb

## 2011-12-22 DIAGNOSIS — J019 Acute sinusitis, unspecified: Secondary | ICD-10-CM

## 2011-12-22 DIAGNOSIS — R05 Cough: Secondary | ICD-10-CM

## 2011-12-22 DIAGNOSIS — R059 Cough, unspecified: Secondary | ICD-10-CM

## 2011-12-22 MED ORDER — AMOXICILLIN-POT CLAVULANATE 875-125 MG PO TABS: 1.0000 | ORAL_TABLET | Freq: Two times a day (BID) | ORAL | Status: DC

## 2011-12-22 MED ORDER — HYDROCODONE-HOMATROPINE 5-1.5 MG/5ML PO SYRP: ORAL_SOLUTION | ORAL | Status: DC

## 2011-12-22 NOTE — Progress Notes (Signed)
Patient ID: Holly Kaufman MRN: [id number], DOB: June 23, 1955, 56 y.o. Date of Encounter: 12/22/2011, 6:50 PM  Primary Physician: Seymour Bars, DO  Chief Complaint:  Chief Complaint  Patient presents with  . Cough    x 3 days  . Nasal Congestion    HPI: 56 y.o. year old female presents with an 8 day history of nasal congestion, post nasal drip, sore throat, sinus pressure, and cough. Afebrile. No chills. Nasal congestion thick and green/yellow. Sinus pressure is the worst symptom. Cough is not productive worse at night, keeping her awake. Ears feel full, leading to sensation of muffled hearing. Has tried OTC cold preps without success. No GI complaints. Appetite normal.  No recent antibiotics, recent travels, or sick contacts   No leg trauma, sedentary periods, h/o cancer, or tobacco use.  Past Medical History  Diagnosis Date  . Allergy   . Anemia   . Arthritis   . GERD (gastroesophageal reflux disease)   . Thyroid disease     hypothyroid  . Ulcer      Home Meds: Prior to Admission medications   Medication Sig Start Date End Date Taking? Authorizing Provider  citalopram (CELEXA) 10 MG tablet 0.5 tablets as needed. 12/06/10  Yes Historical Provider, MD  pantoprazole (PROTONIX) 40 MG tablet Take 40 mg by mouth daily.   Yes Historical Provider, MD  SYNTHROID 50 MCG tablet 1 tablet Daily. 01/05/11  Yes Historical Provider, MD                       IBUPROFEN PO Take by mouth as needed.    Historical Provider, MD  omeprazole (PRILOSEC) 40 MG capsule TAKE ONE CAPSULE BY MOUTH ONCE DAILY 02/01/11   Hilarie Fredrickson, MD    Allergies:  Allergies  Allergen Reactions  . Prochlorperazine Edisylate     "heads rolls back and eyes roll back"    History   Social History  . Marital Status: Married    Spouse Name: N/A    Number of Children: N/A  . Years of Education: N/A   Occupational History  . Not on file.   Social History Main Topics  . Smoking status: Never Smoker   .  Smokeless tobacco: Not on file  . Alcohol Use: No  . Drug Use: No  . Sexually Active:    Other Topics Concern  . Not on file   Social History Narrative  . No narrative on file     Review of Systems: Constitutional: negative for chills, fever, night sweats or weight changes Cardiovascular: negative for chest pain or palpitations Respiratory: negative for hemoptysis, wheezing, or shortness of breath Abdominal: negative for abdominal pain, nausea, vomiting or diarrhea Dermatological: negative for rash Neurologic: negative for headache   Physical Exam: Blood pressure 124/79, pulse 69, temperature 98 F (36.7 C), resp. rate 16, height 5' 1.5" (1.562 m), weight 132 lb (59.875 kg), SpO2 98.00%., Body mass index is 24.54 kg/(m^2). General: Well developed, well nourished, in no acute distress. Head: Normocephalic, atraumatic, eyes without discharge, sclera non-icteric, nares are congested. Bilateral auditory canals clear, TM's are without perforation, pearly grey with reflective cone of light bilaterally. Maxillary sinus TTP. Oral cavity moist, dentition normal. Posterior pharynx with post nasal drip and mild erythema. No peritonsillar abscess or tonsillar exudate. Neck: Supple. No thyromegaly. Full ROM. No lymphadenopathy. Lungs: Clear bilaterally to auscultation without wheezes, rales, or rhonchi. Breathing is unlabored.  Heart: RRR with S1 S2. No murmurs, rubs,  or gallops appreciated. Msk:  Strength and tone normal for age. Extremities: No clubbing or cyanosis. No edema. Neuro: Alert and oriented X 3. Moves all extremities spontaneously. CNII-XII grossly in tact. Psych:  Responds to questions appropriately with a normal affect.     ASSESSMENT AND PLAN:  56 y.o. year old female with sinusitis and cough secondary to post nasal drip -Augmentin 875/125 mg 1 po bid #20 no RF -Hycodan #4oz 1 tsp po q 4-6 hours prn cough no RF SED -Mucinex -Tylenol/Motrin prn -Rest/fluids -RTC  precautions -RTC 3-5 days if no improvement  Signed, Eula Listen, PA-C 12/22/2011 6:50 PM

## 2011-12-23 ENCOUNTER — Ambulatory Visit: Payer: 59 | Admitting: Family Medicine

## 2011-12-23 ENCOUNTER — Telehealth: Payer: Self-pay

## 2011-12-23 NOTE — Telephone Encounter (Signed)
Patient was in yesterday and doesn't remember who she saw but she wants to know if the prescription she was given can cause a yeast infection. She is having some symptoms. Please call her to discuss [phone].

## 2011-12-25 MED ORDER — FLUCONAZOLE 150 MG PO TABS: 150.0000 mg | ORAL_TABLET | Freq: Once | ORAL | Status: DC

## 2011-12-25 NOTE — Telephone Encounter (Signed)
Holly Kaufman saw this pt, can we call in Rx? She was given amoxicillin

## 2011-12-25 NOTE — Telephone Encounter (Signed)
Spoke with pt, advised Diflucan sent in for possible yeast infection. Pt understood.

## 2011-12-25 NOTE — Telephone Encounter (Signed)
Diflucan sent in

## 2012-02-27 ENCOUNTER — Other Ambulatory Visit: Payer: Self-pay | Admitting: Internal Medicine

## 2012-03-07 ENCOUNTER — Other Ambulatory Visit (HOSPITAL_BASED_OUTPATIENT_CLINIC_OR_DEPARTMENT_OTHER): Payer: Self-pay | Admitting: Family Medicine

## 2012-03-07 DIAGNOSIS — Z1231 Encounter for screening mammogram for malignant neoplasm of breast: Secondary | ICD-10-CM

## 2012-03-29 ENCOUNTER — Ambulatory Visit (HOSPITAL_BASED_OUTPATIENT_CLINIC_OR_DEPARTMENT_OTHER)
Admission: RE | Admit: 2012-03-29 | Discharge: 2012-03-29 | Disposition: A | Discharge status: Home / Self Care | Payer: 59 | Source: Ambulatory Visit | Attending: Family Medicine | Admitting: Family Medicine

## 2012-03-29 DIAGNOSIS — Z1231 Encounter for screening mammogram for malignant neoplasm of breast: Secondary | ICD-10-CM

## 2012-05-28 ENCOUNTER — Ambulatory Visit (INDEPENDENT_AMBULATORY_CARE_PROVIDER_SITE_OTHER): Payer: 59 | Admitting: Internal Medicine

## 2012-05-28 ENCOUNTER — Encounter: Payer: Self-pay | Admitting: Internal Medicine

## 2012-05-28 VITALS — BP 106/78 | HR 76 | Ht 61.5 in | Wt 133.8 lb

## 2012-05-28 DIAGNOSIS — K219 Gastro-esophageal reflux disease without esophagitis: Secondary | ICD-10-CM

## 2012-05-28 DIAGNOSIS — Z86010 Personal history of colonic polyps: Secondary | ICD-10-CM

## 2012-05-28 DIAGNOSIS — K222 Esophageal obstruction: Secondary | ICD-10-CM

## 2012-05-28 MED ORDER — OMEPRAZOLE 40 MG PO CPDR: 40.0000 mg | DELAYED_RELEASE_CAPSULE | Freq: Every day | ORAL | Status: DC

## 2012-05-28 NOTE — Progress Notes (Signed)
HISTORY OF PRESENT ILLNESS:  Holly Kaufman is a 57 y.o. female with hypothyroidism, hyperlipidemia, chronic GERD complicated by erosive esophagitis and peptic stricture which has required dilation, and adenomatous colon polyps. She presents today for ongoing management of chronic GERD. As well, requests medication refill. The patient was last seen in February of 2013 when she underwent surveillance colonoscopy and therapeutic upper endoscopy with esophageal dilation for symptomatic dysphagia. Colonoscopy revealed diminutive polyps and moderate sigmoid diverticulosis. Polyps were tubular adenomas. Followup in 5 years recommended. Upper endoscopy revealed peptic stricture and hiatal hernia. She was dilated to 82 [place]. She continues on omeprazole 40 mg daily. No active reflux symptoms. Possibly some mild dysphagia, but not bad. No issues with her medications. No interval medical problems. She did have several questions regarding probiotics, colon cleanse, and healthcare for her sister.  REVIEW OF SYSTEMS:  All non-GI ROS negative   Past Medical History  Diagnosis Date  . Allergy   . Anemia   . Arthritis   . GERD (gastroesophageal reflux disease)   . Thyroid disease     hypothyroid  . Ulcer   . Hyperlipidemia   . Colon polyps     adenomatous  . Esophageal stricture   . Diverticulosis   . Internal hemorrhoids   . Hiatal hernia     Past Surgical History  Procedure Laterality Date  . Colonoscopy    . Polypectomy    . Vaginal hysterectomy      partial    Social History Holly Kaufman  reports that she has never smoked. She has never used smokeless tobacco. She reports that she does not drink alcohol or use illicit drugs.  family history includes CVA in her mother; Diabetes in an unspecified family member; Heart disease in her mother; and Hyperlipidemia in her mother.  There is no history of Colon cancer, and Esophageal cancer, and Stomach cancer, and Rectal cancer, .  Allergies   Allergen Reactions  . Prochlorperazine Edisylate     "heads rolls back and eyes roll back"       PHYSICAL EXAMINATION: Vital signs: BP 106/78  Pulse 76  Ht 5' 1.5" (1.562 m)  Wt 133 lb 12.8 oz (60.691 kg)  BMI 24.87 kg/m2 General: Well-developed, well-nourished, no acute distress HEENT: Sclerae are anicteric, conjunctiva pink. Oral mucosa intact Lungs: Clear Heart: Regular Abdomen: soft, nontender, nondistended, no obvious ascites, no peritoneal signs, normal bowel sounds. No organomegaly. Extremities: No edema Psychiatric: alert and oriented x3. Cooperative     ASSESSMENT:  #1. GERD complicated by erosive esophagitis and peptic stricture. Currently asymptomatic post dilation on PPI #2. History of adenomatous colon polyps. Surveillance up-to-date #3. Family history of colon cancer  PLAN:  #1. Continue reflux precautions #2. Refill omeprazole #3. Routine office followup in 2 years. Contact the office in the interim for questions or problems. #4. Surveillance colonoscopy around February 2018

## 2012-05-28 NOTE — Patient Instructions (Addendum)
We have sent the following medications to your pharmacy for you to pick up at your convenience:  Omeprazole  Please follow up with Dr. Perry in 2 years or sooner if needed  

## 2012-09-07 ENCOUNTER — Telehealth: Payer: Self-pay | Admitting: Internal Medicine

## 2012-09-07 NOTE — Telephone Encounter (Signed)
Left message for pt to call back  °

## 2012-09-11 NOTE — Telephone Encounter (Signed)
Pt states she is starting to have a little difficulty swallowing and states she would like to have her "throat stretched." Does pt need an OV or can she be a direct procedure? Please advise. Last procedure was done 03/16/11.

## 2012-09-11 NOTE — Telephone Encounter (Signed)
Direct EGD / dilation [facility]. Thanks

## 2012-09-11 NOTE — Telephone Encounter (Signed)
Pt scheduled for procedure 09/24/12@9 :30am. Pt scheduled for previsit 09/20/12@8 :30am. Pt aware of appt dates and times.

## 2012-09-18 ENCOUNTER — Encounter: Payer: Self-pay | Admitting: Internal Medicine

## 2012-09-24 ENCOUNTER — Encounter: Payer: 59 | Admitting: Internal Medicine

## 2012-11-01 ENCOUNTER — Ambulatory Visit (AMBULATORY_SURGERY_CENTER): Payer: Self-pay | Admitting: *Deleted

## 2012-11-01 VITALS — Ht 61.5 in | Wt 131.0 lb

## 2012-11-01 DIAGNOSIS — R1310 Dysphagia, unspecified: Secondary | ICD-10-CM

## 2012-11-01 NOTE — Progress Notes (Signed)
No egg or soy allergy. ewm No issues with past sedation.ewm Post op nausea/vomiting with sedation per pt. ewm No home 02 use and no cpap. ewm Pt declined emmi video. ewm

## 2012-11-02 ENCOUNTER — Encounter: Payer: Self-pay | Admitting: Internal Medicine

## 2012-11-22 ENCOUNTER — Ambulatory Visit (AMBULATORY_SURGERY_CENTER): Payer: 59 | Admitting: Internal Medicine

## 2012-11-22 ENCOUNTER — Encounter: Payer: Self-pay | Admitting: Internal Medicine

## 2012-11-22 VITALS — BP 124/79 | HR 71 | Temp 98.0°F | Resp 29 | Ht 61.5 in | Wt 131.0 lb

## 2012-11-22 DIAGNOSIS — R1310 Dysphagia, unspecified: Secondary | ICD-10-CM

## 2012-11-22 DIAGNOSIS — K222 Esophageal obstruction: Secondary | ICD-10-CM

## 2012-11-22 DIAGNOSIS — K219 Gastro-esophageal reflux disease without esophagitis: Secondary | ICD-10-CM

## 2012-11-22 MED ORDER — OMEPRAZOLE 40 MG PO CPDR: 40.0000 mg | DELAYED_RELEASE_CAPSULE | Freq: Two times a day (BID) | ORAL | Status: DC

## 2012-11-22 MED ORDER — SODIUM CHLORIDE 0.9 % IV SOLN: 500.0000 mL | INTRAVENOUS | Status: DC

## 2012-11-22 NOTE — Progress Notes (Signed)
Patient did not experience any of the following events: a burn prior to discharge; a fall within the facility; wrong site/side/patient/procedure/implant event; or a hospital transfer or hospital admission upon discharge from the facility. (G8907) Patient did not have preoperative order for IV antibiotic SSI prophylaxis. (G8918)  

## 2012-11-22 NOTE — Op Note (Signed)
[facility] [address] [place], [address]   ENDOSCOPY PROCEDURE REPORT  PATIENT: Holly, Kaufman  MR#: [id number] BIRTHDATE: 09/13/1955 , 57  yrs. old GENDER: Female ENDOSCOPIST: Roxy Cedar, MD REFERRED BY:  .Direct Self PROCEDURE DATE:  11/22/2012 PROCEDURE:  EGD, diagnostic and Maloney dilation of esophagus  - 74 F ASA CLASS:     Class II INDICATIONS:  Dysphagia.   Therapeutic procedure. MEDICATIONS: MAC sedation, administered by CRNA and propofol (Diprivan) 150mg  IV TOPICAL ANESTHETIC: Cetacaine Spray  DESCRIPTION OF PROCEDURE: After the risks benefits and alternatives of the procedure were thoroughly explained, informed consent was obtained.  The LB [id number] endoscope was introduced through the mouth and advanced to the second portion of the duodenum. Without limitations.  The instrument was slowly withdrawn as the mucosa was fully examined.    EXAM:The esophagus revealed a ringlike stricture measuring 15 mm and located at the gastroesophageal junction (35 cm).  In addition, there was erosive esophagitis in the distal centimeter.  The stomach revealed multiple benign fundic gland-type polyps, but was otherwise normal.  The duodenum was normal.  Retroflexed views revealed a hiatal hernia.     The scope was then withdrawn from the patient and the procedure completed.  THERAPY: 54 French Maloney dilator was passed blindly into the esophagus without resistance or heme. Tolerated well  COMPLICATIONS: There were no complications. ENDOSCOPIC IMPRESSION: 1. Distal esophageal stricture status post dilation 2. Erosive esophagitis despite daily PPI.  RECOMMENDATIONS: 1.  Increase omeprazole to 40 mg twice a day; #60; 11 refills 2.  Clear liquids until 5 PM , then soft foods rest of day.  Resume prior diet tomorrow. 3. Contact Dr. Marina Goodell for any persistent or recurrent swallowing troubles  REPEAT EXAM:  eSigned:  Roxy Cedar, MD  11/22/2012 3:10 PM   FA:[id number] Bowen, DO and The Patient

## 2012-11-22 NOTE — Patient Instructions (Addendum)

## 2012-11-22 NOTE — Progress Notes (Signed)
Called to room to assist during endoscopic procedure.  Patient ID and intended procedure confirmed with present staff. Received instructions for my participation in the procedure from the performing physician. ewm 

## 2012-11-23 ENCOUNTER — Telehealth: Payer: Self-pay | Admitting: *Deleted

## 2012-11-23 NOTE — Telephone Encounter (Signed)
No message left on voicemail on f/u call due to permission to  lmom not clear

## 2012-11-26 ENCOUNTER — Encounter: Payer: 59 | Admitting: Internal Medicine

## 2013-03-25 ENCOUNTER — Other Ambulatory Visit: Payer: Self-pay | Admitting: Internal Medicine

## 2013-04-09 ENCOUNTER — Encounter: Payer: Self-pay | Admitting: Family Medicine

## 2013-04-09 ENCOUNTER — Ambulatory Visit (INDEPENDENT_AMBULATORY_CARE_PROVIDER_SITE_OTHER): Payer: 59 | Admitting: Family Medicine

## 2013-04-09 VITALS — BP 104/70 | HR 67 | Temp 98.4°F | Ht 61.5 in | Wt 135.1 lb

## 2013-04-09 DIAGNOSIS — D509 Iron deficiency anemia, unspecified: Secondary | ICD-10-CM

## 2013-04-09 DIAGNOSIS — M899 Disorder of bone, unspecified: Secondary | ICD-10-CM

## 2013-04-09 DIAGNOSIS — E785 Hyperlipidemia, unspecified: Secondary | ICD-10-CM

## 2013-04-09 DIAGNOSIS — R1013 Epigastric pain: Secondary | ICD-10-CM

## 2013-04-09 DIAGNOSIS — M549 Dorsalgia, unspecified: Secondary | ICD-10-CM

## 2013-04-09 DIAGNOSIS — E039 Hypothyroidism, unspecified: Secondary | ICD-10-CM

## 2013-04-09 DIAGNOSIS — Z0000 Encounter for general adult medical examination without abnormal findings: Secondary | ICD-10-CM

## 2013-04-09 DIAGNOSIS — K219 Gastro-esophageal reflux disease without esophagitis: Secondary | ICD-10-CM

## 2013-04-09 DIAGNOSIS — M949 Disorder of cartilage, unspecified: Secondary | ICD-10-CM

## 2013-04-09 DIAGNOSIS — R7309 Other abnormal glucose: Secondary | ICD-10-CM

## 2013-04-09 LAB — CBC
HEMATOCRIT: 37.1 % (ref 36.0–46.0)
Hemoglobin: 12.4 g/dL (ref 12.0–15.0)
MCH: 29.2 pg (ref 26.0–34.0)
MCHC: 33.4 g/dL (ref 30.0–36.0)
MCV: 87.5 fL (ref 78.0–100.0)
Platelets: 295 10*3/uL (ref 150–400)
RBC: 4.24 MIL/uL (ref 3.87–5.11)
RDW: 13.6 % (ref 11.5–15.5)
WBC: 4.4 10*3/uL (ref 4.0–10.5)

## 2013-04-09 MED ORDER — MINOCYCLINE HCL 50 MG PO TABS: ORAL_TABLET | ORAL | Status: DC

## 2013-04-09 NOTE — Patient Instructions (Signed)
Probiotic daily such away [employer]    Preventive Care for Adults, Female A healthy lifestyle and preventive care can promote health and wellness. Preventive health guidelines for women include the following key practices.  A routine yearly physical is a good way to check with your health care provider about your health and preventive screening. It is a chance to share any concerns and updates on your health and to receive a thorough exam.  Visit your dentist for a routine exam and preventive care every 6 months. Brush your teeth twice a day and floss once a day. Good oral hygiene prevents tooth decay and gum disease.  The frequency of eye exams is based on your age, health, family medical history, use of contact lenses, and other factors. Follow your health care provider's recommendations for frequency of eye exams.  Eat a healthy diet. Foods like vegetables, fruits, whole grains, low-fat dairy products, and lean protein foods contain the nutrients you need without too many calories. Decrease your intake of foods high in solid fats, added sugars, and salt. Eat the right amount of calories for you.Get information about a proper diet from your health care provider, if necessary.  Regular physical exercise is one of the most important things you can do for your health. Most adults should get at least 150 minutes of moderate-intensity exercise (any activity that increases your heart rate and causes you to sweat) each week. In addition, most adults need muscle-strengthening exercises on 2 or more days a week.  Maintain a healthy weight. The body mass index (BMI) is a screening tool to identify possible weight problems. It provides an estimate of body fat based on height and weight. Your health care provider can find your BMI, and can help you achieve or maintain a healthy weight.For adults 20 years and older:  A BMI below 18.5 is considered underweight.  A BMI of 18.5 to 24.9 is  normal.  A BMI of 25 to 29.9 is considered overweight.  A BMI of 30 and above is considered obese.  Maintain normal blood lipids and cholesterol levels by exercising and minimizing your intake of saturated fat. Eat a balanced diet with plenty of fruit and vegetables. Blood tests for lipids and cholesterol should begin at age 48 and be repeated every 5 years. If your lipid or cholesterol levels are high, you are over 50, or you are at high risk for heart disease, you may need your cholesterol levels checked more frequently.Ongoing high lipid and cholesterol levels should be treated with medicines if diet and exercise are not working.  If you smoke, find out from your health care provider how to quit. If you do not use tobacco, do not start.  Lung cancer screening is recommended for adults aged 40 80 years who are at high risk for developing lung cancer because of a history of smoking. A yearly low-dose CT scan of the lungs is recommended for people who have at least a 30-pack-year history of smoking and are a current smoker or have quit within the past 15 years. A pack year of smoking is smoking an average of 1 pack of cigarettes a day for 1 year (for example: 1 pack a day for 30 years or 2 packs a day for 15 years). Yearly screening should continue until the smoker has stopped smoking for at least 15 years. Yearly screening should be stopped for people who develop a health problem that would prevent them from having lung cancer treatment.  If  you are pregnant, do not drink alcohol. If you are breastfeeding, be very cautious about drinking alcohol. If you are not pregnant and choose to drink alcohol, do not have more than 1 drink per day. One drink is considered to be 12 ounces (355 mL) of beer, 5 ounces (148 mL) of wine, or 1.5 ounces (44 mL) of liquor.  Avoid use of street drugs. Do not share needles with anyone. Ask for help if you need support or instructions about stopping the use of  drugs.  High blood pressure causes heart disease and increases the risk of stroke. Your blood pressure should be checked at least every 1 to 2 years. Ongoing high blood pressure should be treated with medicines if weight loss and exercise do not work.  If you are 80 59 years old, ask your health care provider if you should take aspirin to prevent strokes.  Diabetes screening involves taking a blood sample to check your fasting blood sugar level. This should be done once every 3 years, after age 25, if you are within normal weight and without risk factors for diabetes. Testing should be considered at a younger age or be carried out more frequently if you are overweight and have at least 1 risk factor for diabetes.  Breast cancer screening is essential preventive care for women. You should practice "breast self-awareness." This means understanding the normal appearance and feel of your breasts and may include breast self-examination. Any changes detected, no matter how small, should be reported to a health care provider. Women in their 88s and 30s should have a clinical breast exam (CBE) by a health care provider as part of a regular health exam every 1 to 3 years. After age 39, women should have a CBE every year. Starting at age 34, women should consider having a mammogram (breast X-ray test) every year. Women who have a family history of breast cancer should talk to their health care provider about genetic screening. Women at a high risk of breast cancer should talk to their health care providers about having an MRI and a mammogram every year.  Breast cancer gene (BRCA)-related cancer risk assessment is recommended for women who have family members with BRCA-related cancers. BRCA-related cancers include breast, ovarian, tubal, and peritoneal cancers. Having family members with these cancers may be associated with an increased risk for harmful changes (mutations) in the breast cancer genes BRCA1 and BRCA2.  Results of the assessment will determine the need for genetic counseling and BRCA1 and BRCA2 testing.  The Pap test is a screening test for cervical cancer. A Pap test can show cell changes on the cervix that might become cervical cancer if left untreated. A Pap test is a procedure in which cells are obtained and examined from the lower end of the uterus (cervix).  Women should have a Pap test starting at age 32.  Between ages 110 and 66, Pap tests should be repeated every 2 years.  Beginning at age 46, you should have a Pap test every 3 years as long as the past 3 Pap tests have been normal.  Some women have medical problems that increase the chance of getting cervical cancer. Talk to your health care provider about these problems. It is especially important to talk to your health care provider if a new problem develops soon after your last Pap test. In these cases, your health care provider may recommend more frequent screening and Pap tests.  The above recommendations are the same for  women who have or have not gotten the vaccine for human papillomavirus (HPV).  If you had a hysterectomy for a problem that was not cancer or a condition that could lead to cancer, then you no longer need Pap tests. Even if you no longer need a Pap test, a regular exam is a good idea to make sure no other problems are starting.  If you are between ages 85 and 61 years, and you have had normal Pap tests going back 10 years, you no longer need Pap tests. Even if you no longer need a Pap test, a regular exam is a good idea to make sure no other problems are starting.  If you have had past treatment for cervical cancer or a condition that could lead to cancer, you need Pap tests and screening for cancer for at least 20 years after your treatment.  If Pap tests have been discontinued, risk factors (such as a new sexual partner) need to be reassessed to determine if screening should be resumed.  The HPV test is an  additional test that may be used for cervical cancer screening. The HPV test looks for the virus that can cause the cell changes on the cervix. The cells collected during the Pap test can be tested for HPV. The HPV test could be used to screen women aged 33 years and older, and should be used in women of any age who have unclear Pap test results. After the age of 66, women should have HPV testing at the same frequency as a Pap test.  Colorectal cancer can be detected and often prevented. Most routine colorectal cancer screening begins at the age of 52 years and continues through age 13 years. However, your health care provider may recommend screening at an earlier age if you have risk factors for colon cancer. On a yearly basis, your health care provider may provide home test kits to check for hidden blood in the stool. Use of a small camera at the end of a tube, to directly examine the colon (sigmoidoscopy or colonoscopy), can detect the earliest forms of colorectal cancer. Talk to your health care provider about this at age 6, when routine screening begins. Direct exam of the colon should be repeated every 5 10 years through age 39 years, unless early forms of pre-cancerous polyps or small growths are found.  People who are at an increased risk for hepatitis B should be screened for this virus. You are considered at high risk for hepatitis B if:  You were born in a country where hepatitis B occurs often. Talk with your health care provider about which countries are considered high risk.  Your parents were born in a high-risk country and you have not received a shot to protect against hepatitis B (hepatitis B vaccine).  You have HIV or AIDS.  You use needles to inject street drugs.  You live with, or have sex with, someone who has Hepatitis B.  You get hemodialysis treatment.  You take certain medicines for conditions like cancer, organ transplantation, and autoimmune conditions.  Hepatitis C  blood testing is recommended for all people born from [birth year] through 1965 and any individual with known risks for hepatitis C.  Practice safe sex. Use condoms and avoid high-risk sexual practices to reduce the spread of sexually transmitted infections (STIs). STIs include gonorrhea, chlamydia, syphilis, trichomonas, herpes, HPV, and human immunodeficiency virus (HIV). Herpes, HIV, and HPV are viral illnesses that have no cure. They can result in  disability, cancer, and death. Sexually active women aged 31 years and younger should be checked for chlamydia. Older women with new or multiple partners should also be tested for chlamydia. Testing for other STIs is recommended if you are sexually active and at increased risk.  Osteoporosis is a disease in which the bones lose minerals and strength with aging. This can result in serious bone fractures or breaks. The risk of osteoporosis can be identified using a bone density scan. Women ages 75 years and over and women at risk for fractures or osteoporosis should discuss screening with their health care providers. Ask your health care provider whether you should take a calcium supplement or vitamin D to reduce the rate of osteoporosis.  Menopause can be associated with physical symptoms and risks. Hormone replacement therapy is available to decrease symptoms and risks. You should talk to your health care provider about whether hormone replacement therapy is right for you.  Use sunscreen. Apply sunscreen liberally and repeatedly throughout the day. You should seek shade when your shadow is shorter than you. Protect yourself by wearing long sleeves, pants, a wide-brimmed hat, and sunglasses year round, whenever you are outdoors.  Once a month, do a whole body skin exam, using a mirror to look at the skin on your back. Tell your health care provider of new moles, moles that have irregular borders, moles that are larger than a pencil eraser, or moles that have changed  in shape or color.  Stay current with required vaccines (immunizations).  Influenza vaccine. All adults should be immunized every year.  Tetanus, diphtheria, and acellular pertussis (Td, Tdap) vaccine. Pregnant women should receive 1 dose of Tdap vaccine during each pregnancy. The dose should be obtained regardless of the length of time since the last dose. Immunization is preferred during the 27th 36th week of gestation. An adult who has not previously received Tdap or who does not know her vaccine status should receive 1 dose of Tdap. This initial dose should be followed by tetanus and diphtheria toxoids (Td) booster doses every 10 years. Adults with an unknown or incomplete history of completing a 3-dose immunization series with Td-containing vaccines should begin or complete a primary immunization series including a Tdap dose. Adults should receive a Td booster every 10 years.  Varicella vaccine. An adult without evidence of immunity to varicella should receive 2 doses or a second dose if she has previously received 1 dose. Pregnant females who do not have evidence of immunity should receive the first dose after pregnancy. This first dose should be obtained before leaving the health care facility. The second dose should be obtained 4 8 weeks after the first dose.  Human papillomavirus (HPV) vaccine. Females aged 74 26 years who have not received the vaccine previously should obtain the 3-dose series. The vaccine is not recommended for use in pregnant females. However, pregnancy testing is not needed before receiving a dose. If a female is found to be pregnant after receiving a dose, no treatment is needed. In that case, the remaining doses should be delayed until after the pregnancy. Immunization is recommended for any person with an immunocompromised condition through the age of 30 years if she did not get any or all doses earlier. During the 3-dose series, the second dose should be obtained 4 8 weeks  after the first dose. The third dose should be obtained 24 weeks after the first dose and 16 weeks after the second dose.  Zoster vaccine. One dose is recommended  for adults aged 41 years or older unless certain conditions are present.  Measles, mumps, and rubella (MMR) vaccine. Adults born before [birth year] generally are considered immune to measles and mumps. Adults born in [birth year] or later should have 1 or more doses of MMR vaccine unless there is a contraindication to the vaccine or there is laboratory evidence of immunity to each of the three diseases. A routine second dose of MMR vaccine should be obtained at least 28 days after the first dose for students attending postsecondary schools, health care workers, or international travelers. People who received inactivated measles vaccine or an unknown type of measles vaccine during 1963 1967 should receive 2 doses of MMR vaccine. People who received inactivated mumps vaccine or an unknown type of mumps vaccine before 1979 and are at high risk for mumps infection should consider immunization with 2 doses of MMR vaccine. For females of childbearing age, rubella immunity should be determined. If there is no evidence of immunity, females who are not pregnant should be vaccinated. If there is no evidence of immunity, females who are pregnant should delay immunization until after pregnancy. Unvaccinated health care workers born before [birth year] who lack laboratory evidence of measles, mumps, or rubella immunity or laboratory confirmation of disease should consider measles and mumps immunization with 2 doses of MMR vaccine or rubella immunization with 1 dose of MMR vaccine.  Pneumococcal 13-valent conjugate (PCV13) vaccine. When indicated, a person who is uncertain of her immunization history and has no record of immunization should receive the PCV13 vaccine. An adult aged 73 years or older who has certain medical conditions and has not been previously immunized should receive 1  dose of PCV13 vaccine. This PCV13 should be followed with a dose of pneumococcal polysaccharide (PPSV23) vaccine. The PPSV23 vaccine dose should be obtained at least 8 weeks after the dose of PCV13 vaccine. An adult aged 52 years or older who has certain medical conditions and previously received 1 or more doses of PPSV23 vaccine should receive 1 dose of PCV13. The PCV13 vaccine dose should be obtained 1 or more years after the last PPSV23 vaccine dose.  Pneumococcal polysaccharide (PPSV23) vaccine. When PCV13 is also indicated, PCV13 should be obtained first. All adults aged 62 years and older should be immunized. An adult younger than age 4 years who has certain medical conditions should be immunized. Any person who resides in a nursing home or long-term care facility should be immunized. An adult smoker should be immunized. People with an immunocompromised condition and certain other conditions should receive both PCV13 and PPSV23 vaccines. People with human immunodeficiency virus (HIV) infection should be immunized as soon as possible after diagnosis. Immunization during chemotherapy or radiation therapy should be avoided. Routine use of PPSV23 vaccine is not recommended for American Indians, [place] Natives, or people younger than 65 years unless there are medical conditions that require PPSV23 vaccine. When indicated, people who have unknown immunization and have no record of immunization should receive PPSV23 vaccine. One-time revaccination 5 years after the first dose of PPSV23 is recommended for people aged 5 64 years who have chronic kidney failure, nephrotic syndrome, asplenia, or immunocompromised conditions. People who received 1 2 doses of PPSV23 before age 97 years should receive another dose of PPSV23 vaccine at age 66 years or later if at least 5 years have passed since the previous dose. Doses of PPSV23 are not needed for people immunized with PPSV23 at or after age 47 years.  Meningococcal  vaccine. Adults with  asplenia or persistent complement component deficiencies should receive 2 doses of quadrivalent meningococcal conjugate (MenACWY-D) vaccine. The doses should be obtained at least 2 months apart. Microbiologists working with certain meningococcal bacteria, [employer] recruits, people at risk during an outbreak, and people who travel to or live in countries with a high rate of meningitis should be immunized. A first-year college student up through age 83 years who is living in a residence hall should receive a dose if she did not receive a dose on or after her 16th birthday. Adults who have certain high-risk conditions should receive one or more doses of vaccine.  Hepatitis A vaccine. Adults who wish to be protected from this disease, have certain high-risk conditions, work with hepatitis A-infected animals, work in hepatitis A research labs, or travel to or work in countries with a high rate of hepatitis A should be immunized. Adults who were previously unvaccinated and who anticipate close contact with an international adoptee during the first 60 days after arrival in the [place] from a country with a high rate of hepatitis A should be immunized.  Hepatitis B vaccine. Adults who wish to be protected from this disease, have certain high-risk conditions, may be exposed to blood or other infectious body fluids, are household contacts or sex partners of hepatitis B positive people, are clients or workers in certain care facilities, or travel to or work in countries with a high rate of hepatitis B should be immunized.  Haemophilus influenzae type b (Hib) vaccine. A previously unvaccinated person with asplenia or sickle cell disease or having a scheduled splenectomy should receive 1 dose of Hib vaccine. Regardless of previous immunization, a recipient of a hematopoietic stem cell transplant should receive a 3-dose series 6 12 months after her successful transplant. Hib vaccine is not  recommended for adults with HIV infection. Preventive Services / Frequency Ages 39 to 39years  Blood pressure check.** / Every 1 to 2 years.  Lipid and cholesterol check.** / Every 5 years beginning at age 37.  Clinical breast exam.** / Every 3 years for women in their 13s and 71s.  BRCA-related cancer risk assessment.** / For women who have family members with a BRCA-related cancer (breast, ovarian, tubal, or peritoneal cancers).  Pap test.** / Every 2 years from ages 32 through 12. Every 3 years starting at age 67 through age 46 or 96 with a history of 3 consecutive normal Pap tests.  HPV screening.** / Every 3 years from ages 59 through ages 68 to 56 with a history of 3 consecutive normal Pap tests.  Hepatitis C blood test.** / For any individual with known risks for hepatitis C.  Skin self-exam. / Monthly.  Influenza vaccine. / Every year.  Tetanus, diphtheria, and acellular pertussis (Tdap, Td) vaccine.** / Consult your health care provider. Pregnant women should receive 1 dose of Tdap vaccine during each pregnancy. 1 dose of Td every 10 years.  Varicella vaccine.** / Consult your health care provider. Pregnant females who do not have evidence of immunity should receive the first dose after pregnancy.  HPV vaccine. / 3 doses over 6 months, if 92 and younger. The vaccine is not recommended for use in pregnant females. However, pregnancy testing is not needed before receiving a dose.  Measles, mumps, rubella (MMR) vaccine.** / You need at least 1 dose of MMR if you were born in 1957 or later. You may also need a 2nd dose. For females of childbearing age, rubella immunity should be determined. If there  is no evidence of immunity, females who are not pregnant should be vaccinated. If there is no evidence of immunity, females who are pregnant should delay immunization until after pregnancy.  Pneumococcal 13-valent conjugate (PCV13) vaccine.** / Consult your health care  provider.  Pneumococcal polysaccharide (PPSV23) vaccine.** / 1 to 2 doses if you smoke cigarettes or if you have certain conditions.  Meningococcal vaccine.** / 1 dose if you are age 27 to 61 years and a Market researcher living in a residence hall, or have one of several medical conditions, you need to get vaccinated against meningococcal disease. You may also need additional booster doses.  Hepatitis A vaccine.** / Consult your health care provider.  Hepatitis B vaccine.** / Consult your health care provider.  Haemophilus influenzae type b (Hib) vaccine.** / Consult your health care provider. Ages 32 to 64years  Blood pressure check.** / Every 1 to 2 years.  Lipid and cholesterol check.** / Every 5 years beginning at age 78 years.  Lung cancer screening. / Every year if you are aged 36 80 years and have a 30-pack-year history of smoking and currently smoke or have quit within the past 15 years. Yearly screening is stopped once you have quit smoking for at least 15 years or develop a health problem that would prevent you from having lung cancer treatment.  Clinical breast exam.** / Every year after age 64 years.  BRCA-related cancer risk assessment.** / For women who have family members with a BRCA-related cancer (breast, ovarian, tubal, or peritoneal cancers).  Mammogram.** / Every year beginning at age 13 years and continuing for as long as you are in good health. Consult with your health care provider.  Pap test.** / Every 3 years starting at age 66 years through age 26 or 42 years with a history of 3 consecutive normal Pap tests.  HPV screening.** / Every 3 years from ages 12 years through ages 49 to 84 years with a history of 3 consecutive normal Pap tests.  Fecal occult blood test (FOBT) of stool. / Every year beginning at age 20 years and continuing until age 25 years. You may not need to do this test if you get a colonoscopy every 10 years.  Flexible sigmoidoscopy or  colonoscopy.** / Every 5 years for a flexible sigmoidoscopy or every 10 years for a colonoscopy beginning at age 34 years and continuing until age 17 years.  Hepatitis C blood test.** / For all people born from [birth year] through 1966 and any individual with known risks for hepatitis C.  Skin self-exam. / Monthly.  Influenza vaccine. / Every year.  Tetanus, diphtheria, and acellular pertussis (Tdap/Td) vaccine.** / Consult your health care provider. Pregnant women should receive 1 dose of Tdap vaccine during each pregnancy. 1 dose of Td every 10 years.  Varicella vaccine.** / Consult your health care provider. Pregnant females who do not have evidence of immunity should receive the first dose after pregnancy.  Zoster vaccine.** / 1 dose for adults aged 62 years or older.  Measles, mumps, rubella (MMR) vaccine.** / You need at least 1 dose of MMR if you were born in 1957 or later. You may also need a 2nd dose. For females of childbearing age, rubella immunity should be determined. If there is no evidence of immunity, females who are not pregnant should be vaccinated. If there is no evidence of immunity, females who are pregnant should delay immunization until after pregnancy.  Pneumococcal 13-valent conjugate (PCV13) vaccine.** / Consult your health  care provider.  Pneumococcal polysaccharide (PPSV23) vaccine.** / 1 to 2 doses if you smoke cigarettes or if you have certain conditions.  Meningococcal vaccine.** / Consult your health care provider.  Hepatitis A vaccine.** / Consult your health care provider.  Hepatitis B vaccine.** / Consult your health care provider.  Haemophilus influenzae type b (Hib) vaccine.** / Consult your health care provider. Ages 50 years and over  Blood pressure check.** / Every 1 to 2 years.  Lipid and cholesterol check.** / Every 5 years beginning at age 34 years.  Lung cancer screening. / Every year if you are aged 31 80 years and have a 30-pack-year history of  smoking and currently smoke or have quit within the past 15 years. Yearly screening is stopped once you have quit smoking for at least 15 years or develop a health problem that would prevent you from having lung cancer treatment.  Clinical breast exam.** / Every year after age 63 years.  BRCA-related cancer risk assessment.** / For women who have family members with a BRCA-related cancer (breast, ovarian, tubal, or peritoneal cancers).  Mammogram.** / Every year beginning at age 73 years and continuing for as long as you are in good health. Consult with your health care provider.  Pap test.** / Every 3 years starting at age 69 years through age 37 or 10 years with 3 consecutive normal Pap tests. Testing can be stopped between 65 and 70 years with 3 consecutive normal Pap tests and no abnormal Pap or HPV tests in the past 10 years.  HPV screening.** / Every 3 years from ages 73 years through ages 40 or 37 years with a history of 3 consecutive normal Pap tests. Testing can be stopped between 65 and 70 years with 3 consecutive normal Pap tests and no abnormal Pap or HPV tests in the past 10 years.  Fecal occult blood test (FOBT) of stool. / Every year beginning at age 82 years and continuing until age 57 years. You may not need to do this test if you get a colonoscopy every 10 years.  Flexible sigmoidoscopy or colonoscopy.** / Every 5 years for a flexible sigmoidoscopy or every 10 years for a colonoscopy beginning at age 70 years and continuing until age 49 years.  Hepatitis C blood test.** / For all people born from [birth year] through 1965 and any individual with known risks for hepatitis C.  Osteoporosis screening.** / A one-time screening for women ages 3 years and over and women at risk for fractures or osteoporosis.  Skin self-exam. / Monthly.  Influenza vaccine. / Every year.  Tetanus, diphtheria, and acellular pertussis (Tdap/Td) vaccine.** / 1 dose of Td every 10 years.  Varicella  vaccine.** / Consult your health care provider.  Zoster vaccine.** / 1 dose for adults aged 21 years or older.  Pneumococcal 13-valent conjugate (PCV13) vaccine.** / Consult your health care provider.  Pneumococcal polysaccharide (PPSV23) vaccine.** / 1 dose for all adults aged 49 years and older.  Meningococcal vaccine.** / Consult your health care provider.  Hepatitis A vaccine.** / Consult your health care provider.  Hepatitis B vaccine.** / Consult your health care provider.  Haemophilus influenzae type b (Hib) vaccine.** / Consult your health care provider. ** Family history and personal history of risk and conditions may change your health care provider's recommendations. Document Released: 03/01/2001 Document Revised: 10/24/2012 Document Reviewed: 05/31/2010 [facility] Patient Information 2014 [place], [place].

## 2013-04-09 NOTE — Progress Notes (Signed)
Pre visit review using our clinic review tool, if applicable. No additional management support is needed unless otherwise documented below in the visit note. 

## 2013-04-10 LAB — RENAL FUNCTION PANEL
Albumin: 4.6 g/dL (ref 3.5–5.2)
BUN: 15 mg/dL (ref 6–23)
CHLORIDE: 105 meq/L (ref 96–112)
CO2: 27 mEq/L (ref 19–32)
CREATININE: 0.88 mg/dL (ref 0.50–1.10)
Calcium: 9.7 mg/dL (ref 8.4–10.5)
GLUCOSE: 89 mg/dL (ref 70–99)
Phosphorus: 4 mg/dL (ref 2.3–4.6)
Potassium: 4.8 mEq/L (ref 3.5–5.3)
Sodium: 141 mEq/L (ref 135–145)

## 2013-04-10 LAB — HEPATIC FUNCTION PANEL
ALBUMIN: 4.6 g/dL (ref 3.5–5.2)
ALT: 16 U/L (ref 0–35)
AST: 23 U/L (ref 0–37)
Alkaline Phosphatase: 89 U/L (ref 39–117)
Bilirubin, Direct: 0.1 mg/dL (ref 0.0–0.3)
Indirect Bilirubin: 0.4 mg/dL (ref 0.2–1.2)
Total Bilirubin: 0.5 mg/dL (ref 0.2–1.2)
Total Protein: 7 g/dL (ref 6.0–8.3)

## 2013-04-10 LAB — LIPID PANEL
Cholesterol: 245 mg/dL — ABNORMAL HIGH (ref 0–200)
HDL: 97 mg/dL (ref >39)
LDL CALC: 131 mg/dL — AB (ref 0–99)
Total CHOL/HDL Ratio: 2.5 Ratio
Triglycerides: 85 mg/dL (ref <150)
VLDL: 17 mg/dL (ref 0–40)

## 2013-04-10 LAB — TSH: TSH: 2.943 u[IU]/mL (ref 0.350–4.500)

## 2013-04-10 LAB — H. PYLORI ANTIBODY, IGG: H PYLORI IGG: 0.59 {ISR}

## 2013-04-14 ENCOUNTER — Encounter: Payer: Self-pay | Admitting: Family Medicine

## 2013-04-14 DIAGNOSIS — M549 Dorsalgia, unspecified: Secondary | ICD-10-CM

## 2013-04-14 HISTORY — DX: Dorsalgia, unspecified: M54.9

## 2013-04-14 NOTE — Assessment & Plan Note (Signed)
wnl today 

## 2013-04-14 NOTE — Assessment & Plan Note (Signed)
Resolved

## 2013-04-14 NOTE — Progress Notes (Signed)
Patient ID: Holly Kaufman, female   DOB: [birth year], 58 y.o.   MRN: [id number] BRITTANNIE TAWNEY [id number] May 30, 1955 04/14/2013      Progress Note New Patient  Subjective  Chief Complaint  Chief Complaint  Patient presents with  . Establish Care    new patient    HPI  Patient is a 58 year old female in today for routine medical care. She is here today to set. She dose she is generally in good health but has noted ongoing problems with her back and her gastrointestinal system. She has chronic heartburn and dyspepsia. Has had trouble both with esophagitis and gastritis in the past. Has had trouble with anemia in the past as well. Has frequent Marlowe Sax. Requested trial of minocycline to see if this helps her dyspepsia. She follows with chiropractic and that her parents for her back pain. Does have a gynecologist. Has been seen by neurosurgery in the past for her back and being told she is not a surgical candidate finally she is concerned about weight gain. No recent illness. Denies CP/palp/SOB/HA/congestion/fevers/GU c/o. Taking meds as prescribed  Past Medical History  Diagnosis Date  . Allergy   . Anemia   . Arthritis   . GERD (gastroesophageal reflux disease)   . Thyroid disease     hypothyroid  . Ulcer   . Hyperlipidemia   . Colon polyps     adenomatous  . Esophageal stricture   . Diverticulosis   . Internal hemorrhoids   . Hiatal hernia   . Chicken pox as a child  . Measles as a child  . [place] measles as a child  . Mumps as a child  . Shingles 50 yrs old  . Anxiety     celexa occasionally  . Back pain 04/14/2013    Past Surgical History  Procedure Laterality Date  . Colonoscopy    . Polypectomy    . Vaginal hysterectomy  58 yrs old    partial  . Wisdom tooth extraction  58 yrs old    Family History  Problem Relation Age of Onset  . Colon cancer Neg Hx   . Esophageal cancer Neg Hx   . Rectal cancer Neg Hx   . Heart disease Mother   . Hyperlipidemia Mother    . CVA Mother   . Stomach cancer Paternal Uncle   . Other Father     dissected aorta  . Hyperlipidemia Sister   . Stroke Maternal Grandmother   . Diabetes Maternal Grandfather   . Heart disease Maternal Grandfather   . Diabetes Paternal Grandmother   . Stroke Paternal Grandfather     History   Social History  . Marital Status: Married    Spouse Name: N/A    Number of Children: 1  . Years of Education: N/A   Occupational History  . Real estate    Social History Main Topics  . Smoking status: Never Smoker   . Smokeless tobacco: Never Used  . Alcohol Use: No  . Drug Use: No  . Sexual Activity: Yes     Comment: lives with husband, minimize gluten   Other Topics Concern  . Not on file   Social History Narrative  . No narrative on file    Current Outpatient Prescriptions on File Prior to Visit  Medication Sig Dispense Refill  . citalopram (CELEXA) 10 MG tablet 10 mg as needed.       . IBUPROFEN PO Take by mouth as needed.      Marland Kitchen  omeprazole (PRILOSEC) 40 MG capsule Take 1 capsule (40 mg total) by mouth 2 (two) times daily before a meal.  60 capsule  11  . SYNTHROID 50 MCG tablet 1 tablet Daily.      . DiphenhydrAMINE HCl (BENADRYL PO) Take by mouth as needed.       No current facility-administered medications on file prior to visit.    Allergies  Allergen Reactions  . Prochlorperazine Edisylate     "heads rolls back and eyes roll back"-compazine    Review of Systems  Review of Systems  Constitutional: Negative for fever, chills and malaise/fatigue.  HENT: Negative for congestion, hearing loss and nosebleeds.   Eyes: Negative for discharge.  Respiratory: Negative for cough, sputum production, shortness of breath and wheezing.   Cardiovascular: Negative for chest pain, palpitations and leg swelling.  Gastrointestinal: Positive for heartburn. Negative for nausea, vomiting, abdominal pain, diarrhea, constipation and blood in stool.  Genitourinary: Negative for  dysuria, urgency, frequency and hematuria.  Musculoskeletal: Positive for back pain. Negative for falls and myalgias.  Skin: Negative for rash.  Neurological: Negative for dizziness, tremors, sensory change, focal weakness, loss of consciousness, weakness and headaches.  Endo/Heme/Allergies: Negative for polydipsia. Does not bruise/bleed easily.  Psychiatric/Behavioral: Negative for depression and suicidal ideas. The patient is not nervous/anxious and does not have insomnia.     Objective  BP 104/70  Pulse 67  Temp(Src) 98.4 F (36.9 C) (Oral)  Ht 5' 1.5" (1.562 m)  Wt 135 lb 1.9 oz (61.29 kg)  BMI 25.12 kg/m2  SpO2 94%  Physical Exam  Physical Exam  Constitutional: She is oriented to person, place, and time and well-developed, well-nourished, and in no distress. No distress.  HENT:  Head: Normocephalic and atraumatic.  Right Ear: External ear normal.  Left Ear: External ear normal.  Nose: Nose normal.  Mouth/Throat: Oropharynx is clear and moist. No oropharyngeal exudate.  Eyes: Conjunctivae are normal. Pupils are equal, round, and reactive to light. Right eye exhibits no discharge. Left eye exhibits no discharge. No scleral icterus.  Neck: Normal range of motion. Neck supple. No thyromegaly present.  Cardiovascular: Normal rate, regular rhythm, normal heart sounds and intact distal pulses.   No murmur heard. Pulmonary/Chest: Effort normal and breath sounds normal. No respiratory distress. She has no wheezes. She has no rales.  Abdominal: Soft. Bowel sounds are normal. She exhibits no distension and no mass. There is no tenderness.  Musculoskeletal: Normal range of motion. She exhibits no edema and no tenderness.  Lymphadenopathy:    She has no cervical adenopathy.  Neurological: She is alert and oriented to person, place, and time. She has normal reflexes. No cranial nerve deficit. Coordination normal.  Skin: Skin is warm and dry. No rash noted. She is not diaphoretic.   Psychiatric: Mood, memory and affect normal.       Assessment & Plan  GERD Avoid offending foods, start probiotics. Do not eat large meals in late evening and consider raising head of bed. Requested trial of Minocycline to see if this helps. Is allowed to try warned of possible risk and encouraged to maintain probiotics  HYPOTHYROIDISM On Levothyroxine, continue to monitor  HYPERLIPIDEMIA Encouraged heart healthy diet, increase exercise, avoid trans fats, consider a krill oil cap daily  OSTEOPENIA Encouraged regular exercise and calcium/vitamin d supplementation, monitor  HYPERGLYCEMIA wnl today  Back pain Manges with conservative measures, consider topical treatments prn.   ANEMIA, IRON DEFICIENCY Resolved.

## 2013-04-14 NOTE — Assessment & Plan Note (Signed)
On Levothyroxine, continue to monitor 

## 2013-04-14 NOTE — Assessment & Plan Note (Signed)
Manges with conservative measures, consider topical treatments prn.

## 2013-04-14 NOTE — Assessment & Plan Note (Signed)
Encouraged regular exercise and calcium/vitamin d supplementation, monitor

## 2013-04-14 NOTE — Assessment & Plan Note (Signed)
Encouraged heart healthy diet, increase exercise, avoid trans fats, consider a krill oil cap daily 

## 2013-04-14 NOTE — Assessment & Plan Note (Addendum)
Avoid offending foods, start probiotics. Do not eat large meals in late evening and consider raising head of bed. Requested trial of Minocycline to see if this helps. Is allowed to try warned of possible risk and encouraged to maintain probiotics

## 2013-04-22 ENCOUNTER — Other Ambulatory Visit: Payer: Self-pay | Admitting: Internal Medicine

## 2013-06-20 ENCOUNTER — Encounter: Payer: 59 | Admitting: Family Medicine

## 2013-08-15 ENCOUNTER — Encounter: Payer: 59 | Admitting: Family Medicine

## 2013-08-16 ENCOUNTER — Encounter: Payer: 59 | Admitting: Family Medicine

## 2013-08-23 ENCOUNTER — Ambulatory Visit (INDEPENDENT_AMBULATORY_CARE_PROVIDER_SITE_OTHER): Payer: 59 | Admitting: Family Medicine

## 2013-08-23 ENCOUNTER — Other Ambulatory Visit (HOSPITAL_COMMUNITY)
Admission: RE | Admit: 2013-08-23 | Discharge: 2013-08-23 | Disposition: A | Discharge status: Home / Self Care | Payer: 59 | Source: Ambulatory Visit | Attending: Family Medicine | Admitting: Family Medicine

## 2013-08-23 ENCOUNTER — Encounter: Payer: Self-pay | Admitting: Family Medicine

## 2013-08-23 VITALS — BP 118/84 | HR 74 | Temp 98.7°F | Ht 61.5 in | Wt 133.1 lb

## 2013-08-23 DIAGNOSIS — Z124 Encounter for screening for malignant neoplasm of cervix: Secondary | ICD-10-CM

## 2013-08-23 DIAGNOSIS — E039 Hypothyroidism, unspecified: Secondary | ICD-10-CM

## 2013-08-23 DIAGNOSIS — Z01419 Encounter for gynecological examination (general) (routine) without abnormal findings: Secondary | ICD-10-CM | POA: Diagnosis present

## 2013-08-23 DIAGNOSIS — K219 Gastro-esophageal reflux disease without esophagitis: Secondary | ICD-10-CM

## 2013-08-23 DIAGNOSIS — R7309 Other abnormal glucose: Secondary | ICD-10-CM

## 2013-08-23 DIAGNOSIS — R739 Hyperglycemia, unspecified: Secondary | ICD-10-CM

## 2013-08-23 DIAGNOSIS — E785 Hyperlipidemia, unspecified: Secondary | ICD-10-CM

## 2013-08-23 DIAGNOSIS — Z0000 Encounter for general adult medical examination without abnormal findings: Secondary | ICD-10-CM

## 2013-08-23 LAB — RENAL FUNCTION PANEL
ALBUMIN: 4.5 g/dL (ref 3.5–5.2)
BUN: 12 mg/dL (ref 6–23)
CO2: 30 meq/L (ref 19–32)
Calcium: 10.4 mg/dL (ref 8.4–10.5)
Chloride: 100 mEq/L (ref 96–112)
Creat: 0.92 mg/dL (ref 0.50–1.10)
Glucose, Bld: 89 mg/dL (ref 70–99)
PHOSPHORUS: 4.6 mg/dL (ref 2.3–4.6)
Potassium: 4.8 mEq/L (ref 3.5–5.3)
Sodium: 136 mEq/L (ref 135–145)

## 2013-08-23 LAB — TSH: TSH: 4.438 u[IU]/mL (ref 0.350–4.500)

## 2013-08-23 LAB — CBC
HCT: 36.3 % (ref 36.0–46.0)
HEMOGLOBIN: 12.2 g/dL (ref 12.0–15.0)
MCH: 29.3 pg (ref 26.0–34.0)
MCHC: 33.6 g/dL (ref 30.0–36.0)
MCV: 87.1 fL (ref 78.0–100.0)
PLATELETS: 288 10*3/uL (ref 150–400)
RBC: 4.17 MIL/uL (ref 3.87–5.11)
RDW: 13.8 % (ref 11.5–15.5)
WBC: 4.3 10*3/uL (ref 4.0–10.5)

## 2013-08-23 LAB — LIPID PANEL
Cholesterol: 218 mg/dL — ABNORMAL HIGH (ref 0–200)
HDL: 85 mg/dL (ref >39)
LDL CALC: 120 mg/dL — AB (ref 0–99)
TRIGLYCERIDES: 67 mg/dL (ref <150)
Total CHOL/HDL Ratio: 2.6 Ratio
VLDL: 13 mg/dL (ref 0–40)

## 2013-08-23 LAB — HEPATIC FUNCTION PANEL
ALBUMIN: 4.5 g/dL (ref 3.5–5.2)
ALT: 17 U/L (ref 0–35)
AST: 22 U/L (ref 0–37)
Alkaline Phosphatase: 99 U/L (ref 39–117)
BILIRUBIN DIRECT: 0.1 mg/dL (ref 0.0–0.3)
BILIRUBIN TOTAL: 0.4 mg/dL (ref 0.2–1.2)
Indirect Bilirubin: 0.3 mg/dL (ref 0.2–1.2)
Total Protein: 6.8 g/dL (ref 6.0–8.3)

## 2013-08-23 NOTE — Progress Notes (Signed)
Pre visit review using our clinic review tool, if applicable. No additional management support is needed unless otherwise documented below in the visit note. 

## 2013-08-23 NOTE — Assessment & Plan Note (Signed)
Pap today, no concerns on exam.  

## 2013-08-23 NOTE — Patient Instructions (Signed)
[employer] probiotics, [facility] daily

## 2013-08-23 NOTE — Progress Notes (Signed)
Patient ID: Holly Kaufman, female   DOB: Apr 03, 1955, 58 y.o.   MRN: [id number] YANINA KNUPP [id number] 1955/12/06 08/23/2013      Progress Note-Follow Up  Subjective  Chief Complaint  Chief Complaint  Patient presents with  . Annual Exam    physical  . Gynecologic Exam    pap    HPI  Patient is a 58 year old female in today for routine medical care. No recent illness. Is in today for annual gyn exam today, has been feeling well. No gyn concerns. Denies CP/palp/SOB/HA/congestion/fevers/GI or GU c/o. Taking meds as prescribed  Past Medical History  Diagnosis Date  . Allergy   . Anemia   . Arthritis   . GERD (gastroesophageal reflux disease)   . Thyroid disease     hypothyroid  . Ulcer   . Hyperlipidemia   . Colon polyps     adenomatous  . Esophageal stricture   . Diverticulosis   . Internal hemorrhoids   . Hiatal hernia   . Chicken pox as a child  . Measles as a child  . [place] measles as a child  . Mumps as a child  . Shingles 13 yrs old  . Anxiety     celexa occasionally  . Back pain 04/14/2013  . Cervical cancer screening 07/10/2008    Qualifier: Diagnosis of  By: Loanne Drilling MD, Jacelyn Pi     Past Surgical History  Procedure Laterality Date  . Colonoscopy    . Polypectomy    . Vaginal hysterectomy  58 yrs old    partial  . Wisdom tooth extraction  58 yrs old    Family History  Problem Relation Age of Onset  . Colon cancer Neg Hx   . Esophageal cancer Neg Hx   . Rectal cancer Neg Hx   . Heart disease Mother   . Hyperlipidemia Mother   . CVA Mother   . Stomach cancer Paternal Uncle   . Other Father     dissected aorta  . Hyperlipidemia Sister   . Stroke Maternal Grandmother   . Diabetes Maternal Grandfather   . Heart disease Maternal Grandfather   . Diabetes Paternal Grandmother   . Stroke Paternal Grandfather     History   Social History  . Marital Status: Married    Spouse Name: N/A    Number of Children: 1  . Years of Education: N/A    Occupational History  . Real estate    Social History Main Topics  . Smoking status: Never Smoker   . Smokeless tobacco: Never Used  . Alcohol Use: No  . Drug Use: No  . Sexual Activity: Yes     Comment: lives with husband, minimize gluten   Other Topics Concern  . Not on file   Social History Narrative  . No narrative on file    Current Outpatient Prescriptions on File Prior to Visit  Medication Sig Dispense Refill  . citalopram (CELEXA) 10 MG tablet 10 mg as needed.       . DiphenhydrAMINE HCl (BENADRYL PO) Take by mouth as needed.      . IBUPROFEN PO Take by mouth as needed.      Marland Kitchen omeprazole (PRILOSEC) 40 MG capsule TAKE 1 CAPSULE (40 MG TOTAL) BY MOUTH DAILY.  90 capsule  3  . SYNTHROID 50 MCG tablet 1 tablet Daily.       No current facility-administered medications on file prior to visit.    Allergies  Allergen Reactions  .  Prochlorperazine Edisylate     "heads rolls back and eyes roll back"-compazine    Review of Systems  Review of Systems  Constitutional: Negative for fever, chills and malaise/fatigue.  HENT: Negative for hearing loss and nosebleeds.   Eyes: Negative for discharge.  Respiratory: Negative for cough, sputum production, shortness of breath and wheezing.   Cardiovascular: Negative for chest pain, palpitations and leg swelling.  Gastrointestinal: Negative for heartburn, nausea, vomiting, abdominal pain, diarrhea, constipation and blood in stool.  Genitourinary: Negative for dysuria, urgency, frequency and hematuria.  Musculoskeletal: Negative for back pain, falls and myalgias.  Skin: Negative for rash.  Neurological: Negative for dizziness, tremors, sensory change, focal weakness, loss of consciousness, weakness and headaches.  Endo/Heme/Allergies: Negative for polydipsia. Does not bruise/bleed easily.  Psychiatric/Behavioral: Negative for depression and suicidal ideas. The patient is not nervous/anxious and does not have insomnia.      Objective  BP 118/84  Pulse 74  Temp(Src) 98.7 F (37.1 C) (Oral)  Ht 5' 1.5" (1.562 m)  Wt 133 lb 1.3 oz (60.365 kg)  BMI 24.74 kg/m2  SpO2 92%  Physical Exam  58  Lab Results  Component Value Date   TSH 2.943 04/09/2013   Lab Results  Component Value Date   WBC 4.4 04/09/2013   HGB 12.4 04/09/2013   HCT 37.1 04/09/2013   MCV 87.5 04/09/2013   PLT 295 04/09/2013   Lab Results  Component Value Date   CREATININE 0.88 04/09/2013   BUN 15 04/09/2013   NA 141 04/09/2013   K 4.8 04/09/2013   CL 105 04/09/2013   CO2 27 04/09/2013   Lab Results  Component Value Date   ALT 16 04/09/2013   AST 23 04/09/2013   ALKPHOS 89 04/09/2013   BILITOT 0.5 04/09/2013   Lab Results  Component Value Date   CHOL 245* 04/09/2013   Lab Results  Component Value Date   HDL 97 04/09/2013   Lab Results  Component Value Date   LDLCALC 131* 04/09/2013   Lab Results  Component Value Date   TRIG 85 04/09/2013   Lab Results  Component Value Date   CHOLHDL 2.5 04/09/2013     Assessment & Plan  Cervical cancer screening Pap today, no concerns on exam.   GERD Avoid offending foods, start probiotics. Do not eat large meals in late evening and consider raising head of bed. Continue current meds  HYPOTHYROIDISM On Levothyroxine, continue to monitor  HYPERLIPIDEMIA Encouraged heart healthy diet, increase exercise, avoid trans fats, consider a krill oil cap daily  HYPERGLYCEMIA Minimize simple carbs. Control weight, increase exercise  Preventative health care Patient encouraged to maintain heart healthy diet, regular exercise, adequate sleep. Consider daily probiotics. Take medications as prescribed

## 2013-08-25 DIAGNOSIS — Z0000 Encounter for general adult medical examination without abnormal findings: Secondary | ICD-10-CM | POA: Insufficient documentation

## 2013-08-25 NOTE — Assessment & Plan Note (Signed)
Encouraged heart healthy diet, increase exercise, avoid trans fats, consider a krill oil cap daily 

## 2013-08-25 NOTE — Assessment & Plan Note (Signed)
Avoid offending foods, start probiotics. Do not eat large meals in late evening and consider raising head of bed. Continue current meds 

## 2013-08-25 NOTE — Assessment & Plan Note (Signed)
Patient encouraged to maintain heart healthy diet, regular exercise, adequate sleep. Consider daily probiotics. Take medications as prescribed 

## 2013-08-25 NOTE — Assessment & Plan Note (Signed)
On Levothyroxine, continue to monitor 

## 2013-08-25 NOTE — Assessment & Plan Note (Signed)
Minimize simple carbs. Control weight, increase exercise

## 2013-08-26 LAB — CYTOLOGY - PAP

## 2013-10-30 ENCOUNTER — Telehealth: Payer: Self-pay | Admitting: Family Medicine

## 2013-10-30 MED ORDER — CITALOPRAM HYDROBROMIDE 10 MG PO TABS: 10.0000 mg | ORAL_TABLET | Freq: Every day | ORAL | Status: DC

## 2013-10-30 NOTE — Telephone Encounter (Signed)
Caller name: Rogan  Call back number: [phone] Pharmacy: [address]  Reason for call:  Pt wants a refill on Rx citalopram (CELEXA) 10 MG tablet

## 2013-10-30 NOTE — Telephone Encounter (Signed)
Celexa refilled per protocol. JG//CMA

## 2014-01-22 ENCOUNTER — Encounter (HOSPITAL_COMMUNITY): Payer: Self-pay | Admitting: Emergency Medicine

## 2014-01-22 ENCOUNTER — Emergency Department (HOSPITAL_COMMUNITY)
Admission: EM | Admit: 2014-01-22 | Discharge: 2014-01-22 | Disposition: A | Discharge status: Home / Self Care | Payer: 59 | Attending: Emergency Medicine | Admitting: Emergency Medicine

## 2014-01-22 DIAGNOSIS — Z8659 Personal history of other mental and behavioral disorders: Secondary | ICD-10-CM | POA: Diagnosis not present

## 2014-01-22 DIAGNOSIS — Z86010 Personal history of colonic polyps: Secondary | ICD-10-CM | POA: Diagnosis not present

## 2014-01-22 DIAGNOSIS — R110 Nausea: Secondary | ICD-10-CM | POA: Diagnosis not present

## 2014-01-22 DIAGNOSIS — R42 Dizziness and giddiness: Secondary | ICD-10-CM | POA: Insufficient documentation

## 2014-01-22 DIAGNOSIS — Z862 Personal history of diseases of the blood and blood-forming organs and certain disorders involving the immune mechanism: Secondary | ICD-10-CM | POA: Diagnosis not present

## 2014-01-22 DIAGNOSIS — M1990 Unspecified osteoarthritis, unspecified site: Secondary | ICD-10-CM | POA: Insufficient documentation

## 2014-01-22 DIAGNOSIS — Z8619 Personal history of other infectious and parasitic diseases: Secondary | ICD-10-CM | POA: Diagnosis not present

## 2014-01-22 DIAGNOSIS — E039 Hypothyroidism, unspecified: Secondary | ICD-10-CM | POA: Diagnosis not present

## 2014-01-22 DIAGNOSIS — K219 Gastro-esophageal reflux disease without esophagitis: Secondary | ICD-10-CM | POA: Insufficient documentation

## 2014-01-22 DIAGNOSIS — Z79899 Other long term (current) drug therapy: Secondary | ICD-10-CM | POA: Diagnosis not present

## 2014-01-22 LAB — URINALYSIS, ROUTINE W REFLEX MICROSCOPIC
BILIRUBIN URINE: NEGATIVE
Glucose, UA: NEGATIVE mg/dL
Hgb urine dipstick: NEGATIVE
Ketones, ur: NEGATIVE mg/dL
Leukocytes, UA: NEGATIVE
Nitrite: NEGATIVE
Protein, ur: NEGATIVE mg/dL
Specific Gravity, Urine: 1.015 (ref 1.005–1.030)
UROBILINOGEN UA: 0.2 mg/dL (ref 0.0–1.0)
pH: 7 (ref 5.0–8.0)

## 2014-01-22 LAB — BASIC METABOLIC PANEL
Anion gap: 10 (ref 5–15)
BUN: 13 mg/dL (ref 6–23)
CO2: 25 mmol/L (ref 19–32)
Calcium: 9.3 mg/dL (ref 8.4–10.5)
Chloride: 104 mEq/L (ref 96–112)
Creatinine, Ser: 0.9 mg/dL (ref 0.50–1.10)
GFR, EST AFRICAN AMERICAN: 80 mL/min — AB (ref >90)
GFR, EST NON AFRICAN AMERICAN: 69 mL/min — AB (ref >90)
Glucose, Bld: 131 mg/dL — ABNORMAL HIGH (ref 70–99)
Potassium: 3.9 mmol/L (ref 3.5–5.1)
Sodium: 139 mmol/L (ref 135–145)

## 2014-01-22 LAB — CBC WITH DIFFERENTIAL/PLATELET
Basophils Absolute: 0 10*3/uL (ref 0.0–0.1)
Basophils Relative: 1 % (ref 0–1)
Eosinophils Absolute: 0.2 10*3/uL (ref 0.0–0.7)
Eosinophils Relative: 3 % (ref 0–5)
HEMATOCRIT: 39.1 % (ref 36.0–46.0)
Hemoglobin: 12 g/dL (ref 12.0–15.0)
LYMPHS ABS: 1 10*3/uL (ref 0.7–4.0)
Lymphocytes Relative: 16 % (ref 12–46)
MCH: 28.4 pg (ref 26.0–34.0)
MCHC: 30.7 g/dL (ref 30.0–36.0)
MCV: 92.4 fL (ref 78.0–100.0)
Monocytes Absolute: 0.4 10*3/uL (ref 0.1–1.0)
Monocytes Relative: 7 % (ref 3–12)
Neutro Abs: 4.6 10*3/uL (ref 1.7–7.7)
Neutrophils Relative %: 73 % (ref 43–77)
Platelets: 259 10*3/uL (ref 150–400)
RBC: 4.23 MIL/uL (ref 3.87–5.11)
RDW: 13.3 % (ref 11.5–15.5)
WBC: 6.2 10*3/uL (ref 4.0–10.5)

## 2014-01-22 MED ORDER — SODIUM CHLORIDE 0.9 % IV BOLUS (SEPSIS)
1000.0000 mL | Freq: Once | INTRAVENOUS | Status: AC
  Administered 2014-01-22: 1000 mL via INTRAVENOUS

## 2014-01-22 MED ORDER — DIAZEPAM 5 MG/ML IJ SOLN
INTRAMUSCULAR | Status: AC
  Filled 2014-01-22: qty 2

## 2014-01-22 MED ORDER — DIAZEPAM 5 MG/ML IJ SOLN
2.5000 mg | Freq: Once | INTRAMUSCULAR | Status: AC
  Administered 2014-01-22: 2.5 mg via INTRAVENOUS
  Filled 2014-01-22: qty 2

## 2014-01-22 MED ORDER — ONDANSETRON 4 MG PO TBDP: 4.0000 mg | ORAL_TABLET | Freq: Three times a day (TID) | ORAL | Status: DC | PRN

## 2014-01-22 MED ORDER — DIAZEPAM 5 MG PO TABS: 5.0000 mg | ORAL_TABLET | Freq: Three times a day (TID) | ORAL | Status: DC | PRN

## 2014-01-22 MED ORDER — MECLIZINE HCL 50 MG PO TABS: 50.0000 mg | ORAL_TABLET | Freq: Three times a day (TID) | ORAL | Status: DC | PRN

## 2014-01-22 MED ORDER — MECLIZINE HCL 25 MG PO TABS
50.0000 mg | ORAL_TABLET | Freq: Once | ORAL | Status: AC
  Administered 2014-01-22: 50 mg via ORAL
  Filled 2014-01-22: qty 2

## 2014-01-22 MED ORDER — ONDANSETRON HCL 4 MG/2ML IJ SOLN
4.0000 mg | Freq: Once | INTRAMUSCULAR | Status: AC
  Administered 2014-01-22: 4 mg via INTRAVENOUS
  Filled 2014-01-22: qty 2

## 2014-01-22 NOTE — ED Notes (Signed)
Infused zofran through upper port on NS drip, flushed with 58ml NS after zofran. Placed syringe with valium on port and drew back NS into syringe and medication went from clear to white. Pulled syringe off port without infusing medication into patient. Called pharmacy to ask if it could've been a medication reaction to a mixture of zofran and valium, pharmacist advised to get a new vial of valium and discard the previous administration. Discarded valium and over-rode a new tube from the pyxis.

## 2014-01-22 NOTE — ED Notes (Signed)
Bed: WA19 Expected date:  Expected time:  Means of arrival:  Comments: EMS 

## 2014-01-22 NOTE — ED Provider Notes (Signed)
TIME SEEN: 11:35 AM  CHIEF COMPLAINT: Vertigo, nausea  HPI: Pt is a 59 y.o. F with history of hyperlipidemia, hypothyroidism who presents to the emergency department with vertigo and nausea. States that she was at her dentist office having her teeth cleaned when they laid her back flat she began feeling slightly lightheaded and with a setter upright she felt the room was spinning. She broke into a cold sweat and had nausea. Denies chest pain or shortness of breath. No headache, numbness, tingling or focal weakness. No ear pain, hearing loss or tinnitus. States vertigo is much worse when she turns her head to the left. No prior history of stroke, diabetes, hypertension, tobacco use.  ROS: See HPI Constitutional: no fever  Eyes: no drainage  ENT: no runny nose   Cardiovascular:  no chest pain  Resp: no SOB  GI: no vomiting GU: no dysuria Integumentary: no rash  Allergy: no hives  Musculoskeletal: no leg swelling  Neurological: no slurred speech ROS otherwise negative  PAST MEDICAL HISTORY/PAST SURGICAL HISTORY:  Past Medical History  Diagnosis Date  . Allergy   . Anemia   . Arthritis   . GERD (gastroesophageal reflux disease)   . Thyroid disease     hypothyroid  . Ulcer   . Hyperlipidemia   . Colon polyps     adenomatous  . Esophageal stricture   . Diverticulosis   . Internal hemorrhoids   . Hiatal hernia   . Chicken pox as a child  . Measles as a child  . [place] measles as a child  . Mumps as a child  . Shingles 64 yrs old  . Anxiety     celexa occasionally  . Back pain 04/14/2013  . Cervical cancer screening 07/10/2008    Qualifier: Diagnosis of  By: Loanne Drilling MD, Jacelyn Pi     MEDICATIONS:  Prior to Admission medications   Medication Sig Start Date End Date Taking? Authorizing Provider  citalopram (CELEXA) 10 MG tablet Take 1 tablet (10 mg total) by mouth daily. 10/30/13   Mosie Lukes, MD  DiphenhydrAMINE HCl (BENADRYL PO) Take by mouth as needed.    Historical  Provider, MD  IBUPROFEN PO Take by mouth as needed.    Historical Provider, MD  omeprazole (PRILOSEC) 40 MG capsule TAKE 1 CAPSULE (40 MG TOTAL) BY MOUTH DAILY.    Irene Shipper, MD  SYNTHROID 50 MCG tablet 1 tablet Daily. 01/05/11   Historical Provider, MD    ALLERGIES:  Allergies  Allergen Reactions  . Prochlorperazine Edisylate     "heads rolls back and eyes roll back"-compazine    SOCIAL HISTORY:  History  Substance Use Topics  . Smoking status: Never Smoker   . Smokeless tobacco: Never Used  . Alcohol Use: No    FAMILY HISTORY: Family History  Problem Relation Age of Onset  . Colon cancer Neg Hx   . Esophageal cancer Neg Hx   . Rectal cancer Neg Hx   . Heart disease Mother   . Hyperlipidemia Mother   . CVA Mother   . Stomach cancer Paternal Uncle   . Other Father     dissected aorta  . Hyperlipidemia Sister   . Stroke Maternal Grandmother   . Diabetes Maternal Grandfather   . Heart disease Maternal Grandfather   . Diabetes Paternal Grandmother   . Stroke Paternal Grandfather     EXAM: BP 141/90 mmHg  Pulse 70  Temp(Src) 97.4 F (36.3 C) (Oral)  Resp 20  SpO2 98% CONSTITUTIONAL: Alert and oriented and responds appropriately to questions. Well-appearing; well-nourished HEAD: Normocephalic EYES: Conjunctivae clear, PERRL ENT: normal nose; no rhinorrhea; moist mucous membranes; pharynx without lesions noted; TMs are clear bilaterally NECK: Supple, no meningismus, no LAD  CARD: RRR; S1 and S2 appreciated; no murmurs, no clicks, no rubs, no gallops RESP: Normal chest excursion without splinting or tachypnea; breath sounds clear and equal bilaterally; no wheezes, no rhonchi, no rales,  ABD/GI: Normal bowel sounds; non-distended; soft, non-tender, no rebound, no guarding BACK:  The back appears normal and is non-tender to palpation, there is no CVA tenderness EXT: Normal ROM in all joints; non-tender to palpation; no edema; normal capillary refill; no cyanosis     SKIN: Normal color for age and race; warm NEURO: Moves all extremities equally, sensation to light touch intact diffusely, strength 5/5 in all 4 extremities, patient is able to ambulate with a normal gait with assistance, cranial nerves II through XII intact, patient does have horizontal fatigable nystagmus when looking to the left PSYCH: The patient's mood and manner are appropriate. Grooming and personal hygiene are appropriate.  MEDICAL DECISION MAKING: Patient here with what appears to be peripheral vertigo. Unable to tolerate EDP attempting Epley's but she does have a positive Dix-Hallpike. Will treat with IV fluids, Zofran and Valium. Low suspicion for posterior stroke, intracranial hemorrhage.  ED PROGRESS: Patient reports feeling much better after Valium, IV fluids and Zofran. Her labs, urine are unremarkable. She states she still is slightly symptomatically she sits upright. We'll give meclizine by mouth challenge and ambulate patient in the ED.   2:04 PM  Pt reports feeling much better. Able to tolerate by mouth. Able to ambulate with assistance. I feel she is safe to be discharged home. We'll discharge with prescriptions for meclizine and Valium to alternated as needed, Zofran for nausea. We'll give information on Epley maneuvers and ENT follow-up. Discussed return precautions. She verbalizes understanding and is comfortable with plan.  [place], DO 01/22/14 1404

## 2014-01-22 NOTE — ED Notes (Signed)
At dentist office, laying in chair when sat up suddenly and had onset of dizziness and nausea. Did not lose consciousness. EMS states negative for orthostatic changes, but pt states hadn't eaten much since yesterday at lunch, per EMS sugar WNL., no pertinent cardiac history.

## 2014-01-22 NOTE — Discharge Instructions (Signed)
Benign Positional Vertigo °Vertigo means you feel like you or your surroundings are moving when they are not. Benign positional vertigo is the most common form of vertigo. Benign means that the cause of your condition is not serious. Benign positional vertigo is more common in older adults. °CAUSES  °Benign positional vertigo is the result of an upset in the labyrinth system. This is an area in the middle ear that helps control your balance. This may be caused by a viral infection, head injury, or repetitive motion. However, often no specific cause is found. °SYMPTOMS  °Symptoms of benign positional vertigo occur when you move your head or eyes in different directions. Some of the symptoms may include: °1. Loss of balance and falls. °2. Vomiting. °3. Blurred vision. °4. Dizziness. °5. Nausea. °6. Involuntary eye movements (nystagmus). °DIAGNOSIS  °Benign positional vertigo is usually diagnosed by physical exam. If the specific cause of your benign positional vertigo is unknown, your caregiver may perform imaging tests, such as magnetic resonance imaging (MRI) or computed tomography (CT). °TREATMENT  °Your caregiver may recommend movements or procedures to correct the benign positional vertigo. Medicines such as meclizine, benzodiazepines, and medicines for nausea may be used to treat your symptoms. In rare cases, if your symptoms are caused by certain conditions that affect the inner ear, you may need surgery. °HOME CARE INSTRUCTIONS  °· Follow your caregiver's instructions. °· Move slowly. Do not make sudden body or head movements. °· Avoid driving. °· Avoid operating heavy machinery. °· Avoid performing any tasks that would be dangerous to you or others during a vertigo episode. °· Drink enough fluids to keep your urine clear or pale yellow. °SEEK IMMEDIATE MEDICAL CARE IF:  °· You develop problems with walking, weakness, numbness, or using your arms, hands, or legs. °· You have difficulty speaking. °· You develop  severe headaches. °· Your nausea or vomiting continues or gets worse. °· You develop visual changes. °· Your family or friends notice any behavioral changes. °· Your condition gets worse. °· You have a fever. °· You develop a stiff neck or sensitivity to light. °MAKE SURE YOU:  °· Understand these instructions. °· Will watch your condition. °· Will get help right away if you are not doing well or get worse. °Document Released: 10/11/2005 Document Revised: 03/28/2011 Document Reviewed: 09/23/2010 °ExitCare® Patient Information ©2015 [employer]. This information is not intended to replace advice given to you by your health care provider. Make sure you discuss any questions you have with your health care provider. ° °Epley Maneuver Self-Care °WHAT IS THE EPLEY MANEUVER? °The Epley maneuver is an exercise you can do to relieve symptoms of benign paroxysmal positional vertigo (BPPV). This condition is often just referred to as vertigo. BPPV is caused by the movement of tiny crystals (canaliths) inside your inner ear. The accumulation and movement of canaliths in your inner ear causes a sudden spinning sensation (vertigo) when you move your head to certain positions. Vertigo usually lasts about 30 seconds. BPPV usually occurs in just one ear. If you get vertigo when you lie on your left side, you probably have BPPV in your left ear. Your health care provider can tell you which ear is involved.  °BPPV may be caused by a head injury. Many people older than 50 get BPPV for unknown reasons. If you have been diagnosed with BPPV, your health care provider may teach you how to do this maneuver. BPPV is not life threatening (benign) and usually goes away in time.  °  WHEN SHOULD I PERFORM THE EPLEY MANEUVER? °You can do this maneuver at home whenever you have symptoms of vertigo. You may do the Epley maneuver up to 3 times a day until your symptoms of vertigo go away. °HOW SHOULD I DO THE EPLEY MANEUVER? °7. Sit on the edge of a  bed or table with your back straight. Your legs should be extended or hanging over the edge of the bed or table.   °8. Turn your head halfway toward the affected ear.   °9. Lie backward quickly with your head turned until you are lying flat on your back. You may want to position a pillow under your shoulders.   °10. Hold this position for 30 seconds. You may experience an attack of vertigo. This is normal. Hold this position until the vertigo stops. °11. Then turn your head to the opposite direction until your unaffected ear is facing the floor.   °12. Hold this position for 30 seconds. You may experience an attack of vertigo. This is normal. Hold this position until the vertigo stops. °13. Now turn your whole body to the same side as your head. Hold for another 30 seconds.   °14. You can then sit back up. °ARE THERE RISKS TO THIS MANEUVER? °In some cases, you may have other symptoms (such as changes in your vision, weakness, or numbness). If you have these symptoms, stop doing the maneuver and call your health care provider. Even if doing these maneuvers relieves your vertigo, you may still have dizziness. Dizziness is the sensation of light-headedness but without the sensation of movement. Even though the Epley maneuver may relieve your vertigo, it is possible that your symptoms will return within 5 years. °WHAT SHOULD I DO AFTER THIS MANEUVER? °After doing the Epley maneuver, you can return to your normal activities. Ask your doctor if there is anything you should do at home to prevent vertigo. This may include: °· Sleeping with two or more pillows to keep your head elevated. °· Not sleeping on the side of your affected ear. °· Getting up slowly from bed. °· Avoiding sudden movements during the day. °· Avoiding extreme head movement, like looking up or bending over. °· Wearing a cervical collar to prevent sudden head movements. °WHAT SHOULD I DO IF MY SYMPTOMS GET WORSE? °Call your health care provider if your  vertigo gets worse. Call your provider right way if you have other symptoms, including:  °· Nausea. °· Vomiting. °· Headache. °· Weakness. °· Numbness. °· Vision changes. °Document Released: 01/08/2013 Document Reviewed: 01/08/2013 °ExitCare® Patient Information ©2015 [employer]. This information is not intended to replace advice given to you by your health care provider. Make sure you discuss any questions you have with your health care provider. ° °

## 2014-01-22 NOTE — ED Notes (Signed)
Pt tolerated po challenge  Well drank a coke

## 2014-01-22 NOTE — ED Notes (Signed)
Pt ambulated with assistance.  

## 2014-01-23 ENCOUNTER — Telehealth: Payer: Self-pay | Admitting: Family Medicine

## 2014-01-23 ENCOUNTER — Other Ambulatory Visit: Payer: Self-pay | Admitting: Family Medicine

## 2014-01-23 DIAGNOSIS — H8110 Benign paroxysmal vertigo, unspecified ear: Secondary | ICD-10-CM

## 2014-01-23 NOTE — Telephone Encounter (Signed)
Caller name: Fojtik Relation to pt: self Call back number: [phone] Pharmacy:  Reason for call:   Patient states that she was at the dentist(Dr. Shelor at [facility], P: [phone]) yesterday and passed out. She was taken to the hospital and was diagnosed with vertigo. She states that she will was told that she would need a referral to go to [facility].

## 2014-01-23 NOTE — Telephone Encounter (Signed)
Please advise.//AB/CMA 

## 2014-01-23 NOTE — Telephone Encounter (Signed)
I have placed referral for ENT but she should also consider coming to see [place] for labs and to rule out other causes of syncope

## 2014-01-24 NOTE — Telephone Encounter (Signed)
She stated she had a bunch of labs done at the hospital and everything was normal. She said her head was raised from being in the lowered position and when she got up the room started spinning. She said they told her to see the ENT and she will follow up with [place] as well. At this time she did not schedule an apt she wanted to wait until after she seen the specialist.        KP

## 2014-03-26 ENCOUNTER — Encounter: Payer: Self-pay | Admitting: Medical

## 2014-03-26 ENCOUNTER — Ambulatory Visit (INDEPENDENT_AMBULATORY_CARE_PROVIDER_SITE_OTHER): Payer: 59 | Admitting: Medical

## 2014-03-26 VITALS — BP 123/78 | HR 81 | Temp 98.1°F | Ht 61.5 in | Wt 134.2 lb

## 2014-03-26 DIAGNOSIS — J309 Allergic rhinitis, unspecified: Secondary | ICD-10-CM | POA: Insufficient documentation

## 2014-03-26 DIAGNOSIS — J302 Other seasonal allergic rhinitis: Secondary | ICD-10-CM | POA: Diagnosis not present

## 2014-03-26 MED ORDER — CEFDINIR 300 MG PO CAPS: 300.0000 mg | ORAL_CAPSULE | Freq: Two times a day (BID) | ORAL | Status: DC

## 2014-03-26 MED ORDER — FLUTICASONE PROPIONATE 50 MCG/ACT NA SUSP: 2.0000 | Freq: Every day | NASAL | Status: DC

## 2014-03-26 MED ORDER — LORATADINE 10 MG PO TABS: 10.0000 mg | ORAL_TABLET | Freq: Every day | ORAL | Status: DC

## 2014-03-26 MED ORDER — OMEPRAZOLE 40 MG PO CPDR: 40.0000 mg | DELAYED_RELEASE_CAPSULE | Freq: Every day | ORAL | Status: DC

## 2014-03-26 NOTE — Assessment & Plan Note (Signed)
Refill of pt omeprazole.

## 2014-03-26 NOTE — Progress Notes (Signed)
Subjective:    Patient ID: Holly Kaufman, female    DOB: 08/17/1955, 59 y.o.   MRN: [id number]  HPI   Pt in with some sinus pressure. Pt has this for 2 wks. Last 2 years some sinus infection. But also descibes recent allergy signs and symptoms such as pnd, nasal congestion and pnd. Some eye itching.  Also she had reflux for 12-15 years.Pt had egd before. She sees GI regularly. She needs refill of her omeprazole. Will see GI in June.   Review of Systems  Constitutional: Negative for chills and fatigue.       Maybe low grade fever.  HENT: Positive for congestion, postnasal drip, rhinorrhea, sneezing and sore throat.        Faint sore throat.  Eyes: Positive for itching.  Respiratory: Positive for cough. Negative for choking, chest tightness, shortness of breath and wheezing.        Cough is mostly dry and occasionally productive.  Cardiovascular: Negative for chest pain and palpitations.  Gastrointestinal: Negative for nausea, vomiting, abdominal pain, diarrhea and constipation.   Past Medical History  Diagnosis Date  . Allergy   . Anemia   . Arthritis   . GERD (gastroesophageal reflux disease)   . Thyroid disease     hypothyroid  . Ulcer   . Hyperlipidemia   . Colon polyps     adenomatous  . Esophageal stricture   . Diverticulosis   . Internal hemorrhoids   . Hiatal hernia   . Chicken pox as a child  . Measles as a child  . [place] measles as a child  . Mumps as a child  . Shingles 27 yrs old  . Anxiety     celexa occasionally  . Back pain 04/14/2013  . Cervical cancer screening 07/10/2008    Qualifier: Diagnosis of  By: Loanne Drilling MD, Jacelyn Pi     History   Social History  . Marital Status: Married    Spouse Name: N/A  . Number of Children: 1  . Years of Education: N/A   Occupational History  . Real estate    Social History Main Topics  . Smoking status: Never Smoker   . Smokeless tobacco: Never Used  . Alcohol Use: No  . Drug Use: No  . Sexual Activity:  Yes     Comment: lives with husband, minimize gluten   Other Topics Concern  . Not on file   Social History Narrative    Past Surgical History  Procedure Laterality Date  . Colonoscopy    . Polypectomy    . Vaginal hysterectomy  59 yrs old    partial  . Wisdom tooth extraction  59 yrs old    Family History  Problem Relation Age of Onset  . Colon cancer Neg Hx   . Esophageal cancer Neg Hx   . Rectal cancer Neg Hx   . Heart disease Mother   . Hyperlipidemia Mother   . CVA Mother   . Stomach cancer Paternal Uncle   . Other Father     dissected aorta  . Hyperlipidemia Sister   . Stroke Maternal Grandmother   . Diabetes Maternal Grandfather   . Heart disease Maternal Grandfather   . Diabetes Paternal Grandmother   . Stroke Paternal Grandfather     Allergies  Allergen Reactions  . Prochlorperazine Edisylate     "heads rolls back and eyes roll back"-compazine    Current Outpatient Prescriptions on File Prior to Visit  Medication Sig  Dispense Refill  . citalopram (CELEXA) 10 MG tablet Take 1 tablet (10 mg total) by mouth daily. 30 tablet 2  . omeprazole (PRILOSEC) 40 MG capsule TAKE 1 CAPSULE (40 MG TOTAL) BY MOUTH DAILY. 90 capsule 3  . SYNTHROID 50 MCG tablet Take 1 tablet by mouth daily before breakfast.      No current facility-administered medications on file prior to visit.    BP 123/78 mmHg  Pulse 81  Temp(Src) 98.1 F (36.7 C) (Oral)  Ht 5' 1.5" (1.562 m)  Wt 134 lb 3.2 oz (60.873 kg)  BMI 24.95 kg/m2  SpO2 100%      Objective:   Physical Exam  General  Mental Status - Alert. General Appearance - Well groomed. Not in acute distress.  Skin Rashes- No Rashes.  HEENT Head- Normal. Ear Auditory Canal - Left- Normal. Right - Normal.Tympanic Membrane- Left- Normal. Right- Normal. Eye Sclera/Conjunctiva- Left- Normal. Right- Normal. Nose & Sinuses Nasal Mucosa- Left-  Boggy and Congested. Right-  Boggy and  Congested.faint bilateral maxillary and  frontal sinus pressure. Mouth & Throat Lips: Upper Lip- Normal: no dryness, cracking, pallor, cyanosis, or vesicular eruption. Lower Lip-Normal: no dryness, cracking, pallor, cyanosis or vesicular eruption. Buccal Mucosa- Bilateral- No Aphthous ulcers. Oropharynx- No Discharge or Erythema. +pnd Tonsils: Characteristics- Bilateral- No Erythema or Congestion. Size/Enlargement- Bilateral- No enlargement. Discharge- bilateral-None.  Neck Neck- Supple. No Masses.   Chest and Lung Exam Auscultation: Breath Sounds:-Clear even and unlabored.  Cardiovascular Auscultation:Rythm- Regular, rate and rhythm. Murmurs & Other Heart Sounds:Ausculatation of the heart reveal- No Murmurs.    Abdomen Inspection:-Inspection Normal.  Palpation/Perucssion: Palpation and Percussion of the abdomen reveal- Non Tender, No Rebound tenderness, No rigidity(Guarding) and No Palpable abdominal masses.  Liver:-Normal.  Spleen:- Normal.          Assessment & Plan:

## 2014-03-26 NOTE — Progress Notes (Signed)
Pre visit review using our clinic review tool, if applicable. No additional management support is needed unless otherwise documented below in the visit note. 

## 2014-03-26 NOTE — Patient Instructions (Signed)
Allergic rhinitis Recent over past 2 weeks with possible sinus infection. RX claritin and flonase. If sinus pressure persists by Friday start cefdnir   EROSIVE ESOPHAGITIS Refill of pt omeprazole.     Follow up 7 days any persisting symptoms or sooner if needed.

## 2014-03-26 NOTE — Assessment & Plan Note (Addendum)
Recent over past 2 weeks with possible sinus infection. RX claritin and flonase. If sinus pressure persists by Friday start cefdnir

## 2014-04-01 ENCOUNTER — Other Ambulatory Visit: Payer: Self-pay | Admitting: Family Medicine

## 2014-07-09 ENCOUNTER — Encounter: Payer: Self-pay | Admitting: Physician Assistant

## 2014-07-09 ENCOUNTER — Ambulatory Visit (INDEPENDENT_AMBULATORY_CARE_PROVIDER_SITE_OTHER): Payer: 59 | Admitting: Physician Assistant

## 2014-07-09 VITALS — BP 108/60 | HR 68 | Temp 98.3°F | Resp 18 | Ht 62.0 in | Wt 132.0 lb

## 2014-07-09 DIAGNOSIS — J45909 Unspecified asthma, uncomplicated: Secondary | ICD-10-CM | POA: Diagnosis not present

## 2014-07-09 DIAGNOSIS — B9689 Other specified bacterial agents as the cause of diseases classified elsewhere: Secondary | ICD-10-CM | POA: Insufficient documentation

## 2014-07-09 DIAGNOSIS — J019 Acute sinusitis, unspecified: Secondary | ICD-10-CM | POA: Diagnosis not present

## 2014-07-09 MED ORDER — AMOXICILLIN-POT CLAVULANATE 875-125 MG PO TABS: 1.0000 | ORAL_TABLET | Freq: Two times a day (BID) | ORAL | Status: DC

## 2014-07-09 MED ORDER — METHYLPREDNISOLONE 4 MG PO TBPK: ORAL_TABLET | ORAL | Status: DC

## 2014-07-09 MED ORDER — HYDROCOD POLST-CPM POLST ER 10-8 MG/5ML PO SUER: 5.0000 mL | Freq: Two times a day (BID) | ORAL | Status: DC | PRN

## 2014-07-09 NOTE — Patient Instructions (Signed)
Please take antibiotic as directed.  Increase fluid intake.  Use Saline nasal spray.  Take a daily multivitamin. Use the Medrol pack given.  Use the Tussionex as directed for cough.  Place a humidifier in the bedroom.  Please call or return clinic if symptoms are not improving.  Sinusitis Sinusitis is redness, soreness, and swelling (inflammation) of the paranasal sinuses. Paranasal sinuses are air pockets within the bones of your face (beneath the eyes, the middle of the forehead, or above the eyes). In healthy paranasal sinuses, mucus is able to drain out, and air is able to circulate through them by way of your nose. However, when your paranasal sinuses are inflamed, mucus and air can become trapped. This can allow bacteria and other germs to grow and cause infection. Sinusitis can develop quickly and last only a short time (acute) or continue over a long period (chronic). Sinusitis that lasts for more than 12 weeks is considered chronic.  CAUSES  Causes of sinusitis include:  Allergies.  Structural abnormalities, such as displacement of the cartilage that separates your nostrils (deviated septum), which can decrease the air flow through your nose and sinuses and affect sinus drainage.  Functional abnormalities, such as when the small hairs (cilia) that line your sinuses and help remove mucus do not work properly or are not present. SYMPTOMS  Symptoms of acute and chronic sinusitis are the same. The primary symptoms are pain and pressure around the affected sinuses. Other symptoms include:  Upper toothache.  Earache.  Headache.  Bad breath.  Decreased sense of smell and taste.  A cough, which worsens when you are lying flat.  Fatigue.  Fever.  Thick drainage from your nose, which often is green and may contain pus (purulent).  Swelling and warmth over the affected sinuses. DIAGNOSIS  Your caregiver will perform a physical exam. During the exam, your caregiver may:  Look in  your nose for signs of abnormal growths in your nostrils (nasal polyps).  Tap over the affected sinus to check for signs of infection.  View the inside of your sinuses (endoscopy) with a special imaging device with a light attached (endoscope), which is inserted into your sinuses. If your caregiver suspects that you have chronic sinusitis, one or more of the following tests may be recommended:  Allergy tests.  Nasal culture A sample of mucus is taken from your nose and sent to a lab and screened for bacteria.  Nasal cytology A sample of mucus is taken from your nose and examined by your caregiver to determine if your sinusitis is related to an allergy. TREATMENT  Most cases of acute sinusitis are related to a viral infection and will resolve on their own within 10 days. Sometimes medicines are prescribed to help relieve symptoms (pain medicine, decongestants, nasal steroid sprays, or saline sprays).  However, for sinusitis related to a bacterial infection, your caregiver will prescribe antibiotic medicines. These are medicines that will help kill the bacteria causing the infection.  Rarely, sinusitis is caused by a fungal infection. In theses cases, your caregiver will prescribe antifungal medicine. For some cases of chronic sinusitis, surgery is needed. Generally, these are cases in which sinusitis recurs more than 3 times per year, despite other treatments. HOME CARE INSTRUCTIONS   Drink plenty of water. Water helps thin the mucus so your sinuses can drain more easily.  Use a humidifier.  Inhale steam 3 to 4 times a day (for example, sit in the bathroom with the shower running).  Apply  a warm, moist washcloth to your face 3 to 4 times a day, or as directed by your caregiver.  Use saline nasal sprays to help moisten and clean your sinuses.  Take over-the-counter or prescription medicines for pain, discomfort, or fever only as directed by your caregiver. SEEK IMMEDIATE MEDICAL CARE  IF:  You have increasing pain or severe headaches.  You have nausea, vomiting, or drowsiness.  You have swelling around your face.  You have vision problems.  You have a stiff neck.  You have difficulty breathing. MAKE SURE YOU:   Understand these instructions.  Will watch your condition.  Will get help right away if you are not doing well or get worse. Document Released: 01/03/2005 Document Revised: 03/28/2011 Document Reviewed: 01/18/2011 [facility] Patient Information 2014 [place], [place].

## 2014-07-09 NOTE — Assessment & Plan Note (Signed)
Rx Medrol dose pack and patient cannot tolerate inhalers due to "making her feel claustrophobic". Supportive measures reviewed.  Follow-up Monday.

## 2014-07-09 NOTE — Assessment & Plan Note (Signed)
Rx Augmentin.  Increase fluids.  Rest.  Saline nasal spray.  Probiotic.  Mucinex as directed.  Humidifier in bedroom. Tussionex and Medrol pack per orders.  Call or return to clinic if symptoms are not improving.

## 2014-07-09 NOTE — Progress Notes (Signed)
Pre visit review using our clinic review tool, if applicable. No additional management support is needed unless otherwise documented below in the visit note. 

## 2014-07-09 NOTE — Progress Notes (Signed)
History of Present Illness: Holly Kaufman is a 59 y.o. female who present to the clinic today complaining of sinus pressure, sinus pain and nasal congestion. Patient endorses headache, PND and non-productive cough.   Patient denies chest pain or SOB.  Endorses symptoms have been present for > 2 weeks and gradually worsening.   History: Past Medical History  Diagnosis Date  . Allergy   . Anemia   . Arthritis   . GERD (gastroesophageal reflux disease)   . Thyroid disease     hypothyroid  . Ulcer   . Hyperlipidemia   . Colon polyps     adenomatous  . Esophageal stricture   . Diverticulosis   . Internal hemorrhoids   . Hiatal hernia   . Chicken pox as a child  . Measles as a child  . [place] measles as a child  . Mumps as a child  . Shingles 25 yrs old  . Anxiety     celexa occasionally  . Back pain 04/14/2013  . Cervical cancer screening 07/10/2008    Qualifier: Diagnosis of  By: Loanne Drilling MD, Hilliard Clark A     Current outpatient prescriptions:  .  citalopram (CELEXA) 10 MG tablet, TAKE 1 TABLET (10 MG TOTAL) BY MOUTH DAILY., Disp: 30 tablet, Rfl: 2 .  fluticasone (FLONASE) 50 MCG/ACT nasal spray, Place 2 sprays into both nostrils daily., Disp: 16 g, Rfl: 1 .  SYNTHROID 50 MCG tablet, Take 1 tablet by mouth daily before breakfast. , Disp: , Rfl:  .  amoxicillin-clavulanate (AUGMENTIN) 875-125 MG per tablet, Take 1 tablet by mouth 2 (two) times daily., Disp: 20 tablet, Rfl: 0 .  chlorpheniramine-HYDROcodone (TUSSIONEX PENNKINETIC ER) 10-8 MG/5ML SUER, Take 5 mLs by mouth every 12 (twelve) hours as needed for cough., Disp: 140 mL, Rfl: 0 .  loratadine (CLARITIN) 10 MG tablet, Take 1 tablet (10 mg total) by mouth daily. (Patient not taking: Reported on 07/09/2014), Disp: 30 tablet, Rfl: 3 .  methylPREDNISolone (MEDROL DOSEPAK) 4 MG TBPK tablet, Take following package direction., Disp: 21 tablet, Rfl: 0 .  omeprazole (PRILOSEC) 40 MG capsule, Take 1 capsule (40 mg total) by mouth daily.  (Patient not taking: Reported on 07/09/2014), Disp: 90 capsule, Rfl: 3 Allergies  Allergen Reactions  . Prochlorperazine Edisylate     "heads rolls back and eyes roll back"-compazine   Family History  Problem Relation Age of Onset  . Colon cancer Neg Hx   . Esophageal cancer Neg Hx   . Rectal cancer Neg Hx   . Heart disease Mother   . Hyperlipidemia Mother   . CVA Mother   . Stomach cancer Paternal Uncle   . Other Father     dissected aorta  . Hyperlipidemia Sister   . Stroke Maternal Grandmother   . Diabetes Maternal Grandfather   . Heart disease Maternal Grandfather   . Diabetes Paternal Grandmother   . Stroke Paternal Grandfather    History   Social History  . Marital Status: Married    Spouse Name: N/A  . Number of Children: 1  . Years of Education: N/A   Occupational History  . Real estate    Social History Main Topics  . Smoking status: Never Smoker   . Smokeless tobacco: Never Used  . Alcohol Use: No  . Drug Use: No  . Sexual Activity: Yes     Comment: lives with husband, minimize gluten   Other Topics Concern  . None   Social History Narrative  Review of Systems: See HPI.  All other ROS are negative.  Physical Examination: BP 108/60 mmHg  Pulse 68  Temp(Src) 98.3 F (36.8 C) (Oral)  Resp 18  Ht 5\' 2"  (1.575 m)  Wt 132 lb (59.875 kg)  BMI 24.14 kg/m2  SpO2 97%  General appearance: alert, cooperative and appears stated age Head: Normocephalic, without obvious abnormality, atraumatic, sinuses tender to percussion Eyes: conjunctivae/corneas clear. PERRL, EOM's intact. Fundi benign. Ears: normal TM's and external ear canals both ears Nose: moderate congestion, turbinates swollen, sinus tenderness bilateral Throat: lips, mucosa, and tongue normal; teeth and gums normal Neck: no adenopathy, no carotid bruit, no JVD, supple, symmetrical, trachea midline and thyroid not enlarged, symmetric, no tenderness/mass/nodules Lungs: wheezing of lung bases  bilaterally Chest wall: no tenderness  Assessment/Plan: RAD (reactive airway disease) with wheezing Rx Medrol dose pack and patient cannot tolerate inhalers due to "making her feel claustrophobic". Supportive measures reviewed.  Follow-up Monday.  Acute bacterial sinusitis Rx Augmentin.  Increase fluids.  Rest.  Saline nasal spray.  Probiotic.  Mucinex as directed.  Humidifier in bedroom. Tussionex and Medrol pack per orders.  Call or return to clinic if symptoms are not improving.

## 2014-07-16 ENCOUNTER — Telehealth: Payer: Self-pay | Admitting: Physician Assistant

## 2014-07-16 MED ORDER — AZITHROMYCIN 250 MG PO TABS: ORAL_TABLET | ORAL | Status: DC

## 2014-07-16 NOTE — Telephone Encounter (Signed)
AVS states for the pt to RTC if not better.  Please advise.//AB/CMA

## 2014-07-16 NOTE — Telephone Encounter (Signed)
Caller name: Brendalee Matthies Relationship to patient: self Can be reached: [phone] Pharmacy: [place]  Reason for call: Pt called in stating she is still coughing up some phlegm. She said it is still down in her chest. She wanted to know if she can get another round of anitbiotics or if she needs to come in?

## 2014-07-16 NOTE — Telephone Encounter (Signed)
Mucinex twice daily. Stay well hydrated.  Cough will be the last thing to go away.  I have sent in short course of Azithromycin.

## 2014-07-16 NOTE — Telephone Encounter (Signed)
Called and spoke with the pt and informed her of the note below.  Pt verbalized understanding and agreed.//AB/CMA 

## 2014-08-01 ENCOUNTER — Other Ambulatory Visit: Payer: Self-pay | Admitting: Family Medicine

## 2014-08-12 ENCOUNTER — Encounter: Payer: Self-pay | Admitting: Family Medicine

## 2014-08-12 ENCOUNTER — Ambulatory Visit (INDEPENDENT_AMBULATORY_CARE_PROVIDER_SITE_OTHER): Payer: Commercial Managed Care - HMO | Admitting: Family Medicine

## 2014-08-12 VITALS — HR 79 | Temp 98.2°F | Ht 61.0 in | Wt 133.1 lb

## 2014-08-12 DIAGNOSIS — R42 Dizziness and giddiness: Secondary | ICD-10-CM

## 2014-08-12 DIAGNOSIS — E038 Other specified hypothyroidism: Secondary | ICD-10-CM | POA: Diagnosis not present

## 2014-08-12 DIAGNOSIS — D509 Iron deficiency anemia, unspecified: Secondary | ICD-10-CM

## 2014-08-12 DIAGNOSIS — M949 Disorder of cartilage, unspecified: Secondary | ICD-10-CM

## 2014-08-12 DIAGNOSIS — M899 Disorder of bone, unspecified: Secondary | ICD-10-CM

## 2014-08-12 DIAGNOSIS — E785 Hyperlipidemia, unspecified: Secondary | ICD-10-CM

## 2014-08-12 DIAGNOSIS — H8110 Benign paroxysmal vertigo, unspecified ear: Secondary | ICD-10-CM | POA: Diagnosis not present

## 2014-08-12 DIAGNOSIS — R739 Hyperglycemia, unspecified: Secondary | ICD-10-CM

## 2014-08-12 DIAGNOSIS — K219 Gastro-esophageal reflux disease without esophagitis: Secondary | ICD-10-CM

## 2014-08-12 MED ORDER — METHYLPREDNISOLONE ACETATE 40 MG/ML IJ SUSP
40.0000 mg | Freq: Once | INTRAMUSCULAR | Status: AC
  Administered 2014-08-12: 40 mg via INTRAMUSCULAR

## 2014-08-12 MED ORDER — ONDANSETRON HCL 4 MG PO TABS: 4.0000 mg | ORAL_TABLET | Freq: Four times a day (QID) | ORAL | Status: DC | PRN

## 2014-08-12 MED ORDER — MECLIZINE HCL 25 MG PO TABS: 25.0000 mg | ORAL_TABLET | Freq: Three times a day (TID) | ORAL | Status: DC | PRN

## 2014-08-12 NOTE — Patient Instructions (Signed)

## 2014-08-12 NOTE — Progress Notes (Signed)
Pre visit review using our clinic review tool, if applicable. No additional management support is needed unless otherwise documented below in the visit note. 

## 2014-08-12 NOTE — Progress Notes (Signed)
Holly Kaufman  [id number] 26-Dec-1955 08/12/2014      Progress Note-Follow Up  Subjective  Chief Complaint  Chief Complaint  Patient presents with  . Dizziness    HPI  Patient is a 59 y.o. female in today for routine medical care. Patient is in today complaining of dizziness. She was at the [facility] out in the [place] in she developed and scratchy throat. Over the pa although it is improving today. She has no vomiting but she has had some nausea. No headache, tinnitus or hearing changes. No other neurologic complaints. Denies CP/palp/SOB/HA/congestion/fevers/GI or GU c/o. Taking meds as prescribed  Past Medical History  Diagnosis Date  . Allergy   . Anemia   . Arthritis   . GERD (gastroesophageal reflux disease)   . Thyroid disease     hypothyroid  . Ulcer   . Hyperlipidemia   . Colon polyps     adenomatous  . Esophageal stricture   . Diverticulosis   . Internal hemorrhoids   . Hiatal hernia   . Chicken pox as a child  . Measles as a child  . [place] measles as a child  . Mumps as a child  . Shingles 59 yrs old  . Anxiety     celexa occasionally  . Back pain 04/14/2013  . Cervical cancer screening 07/10/2008    Qualifier: Diagnosis of  By: Loanne Drilling MD, Jacelyn Pi     Past Surgical History  Procedure Laterality Date  . Colonoscopy    . Polypectomy    . Vaginal hysterectomy  59 yrs old    partial  . Wisdom tooth extraction  59 yrs old    Family History  Problem Relation Age of Onset  . Colon cancer Neg Hx   . Esophageal cancer Neg Hx   . Rectal cancer Neg Hx   . Heart disease Mother   . Hyperlipidemia Mother   . CVA Mother   . Stomach cancer Paternal Uncle   . Other Father     dissected aorta  . Hyperlipidemia Sister   . Stroke Maternal Grandmother   . Diabetes Maternal Grandfather   . Heart disease Maternal Grandfather   . Diabetes Paternal Grandmother   . Stroke Paternal Grandfather     History   Social History  . Marital Status: Married    Spouse  Name: N/A  . Number of Children: 1  . Years of Education: N/A   Occupational History  . Real estate    Social History Main Topics  . Smoking status: Never Smoker   . Smokeless tobacco: Never Used  . Alcohol Use: No  . Drug Use: No  . Sexual Activity: Yes     Comment: lives with husband, minimize gluten   Other Topics Concern  . Not on file   Social History Narrative    Current Outpatient Prescriptions on File Prior to Visit  Medication Sig Dispense Refill  . citalopram (CELEXA) 10 MG tablet TAKE 1 TABLET (10 MG TOTAL) BY MOUTH DAILY. 30 tablet 2  . fluticasone (FLONASE) 50 MCG/ACT nasal spray Place 2 sprays into both nostrils daily. 16 g 1  . loratadine (CLARITIN) 10 MG tablet Take 1 tablet (10 mg total) by mouth daily. 30 tablet 3  . omeprazole (PRILOSEC) 40 MG capsule Take 1 capsule (40 mg total) by mouth daily. 90 capsule 3  . SYNTHROID 50 MCG tablet Take 1 tablet by mouth daily before breakfast.      No current facility-administered medications  on file prior to visit.    Allergies  Allergen Reactions  . Prochlorperazine Edisylate     "heads rolls back and eyes roll back"-compazine    Review of Systems  Review of Systems  Constitutional: Negative for fever and malaise/fatigue.  HENT: Negative for congestion.   Eyes: Negative for discharge.  Respiratory: Negative for shortness of breath.   Cardiovascular: Negative for chest pain, palpitations and leg swelling.  Gastrointestinal: Negative for nausea, abdominal pain and diarrhea.  Genitourinary: Negative for dysuria.  Musculoskeletal: Negative for falls.  Skin: Negative for rash.  Neurological: Positive for dizziness. Negative for loss of consciousness and headaches.  Endo/Heme/Allergies: Negative for polydipsia.  Psychiatric/Behavioral: Negative for depression and suicidal ideas. The patient is not nervous/anxious and does not have insomnia.     Objective  Pulse 79  Temp(Src) 98.2 F (36.8 C) (Oral)  Ht 5'  1" (1.549 m)  Wt 133 lb 2 oz (60.385 kg)  BMI 25.17 kg/m2  SpO2 100%  Physical Exam  Physical Exam  Lab Results  Component Value Date   TSH 4.438 08/23/2013   Lab Results  Component Value Date   WBC 6.2 01/22/2014   HGB 12.0 01/22/2014   HCT 39.1 01/22/2014   MCV 92.4 01/22/2014   PLT 259 01/22/2014   Lab Results  Component Value Date   CREATININE 0.90 01/22/2014   BUN 13 01/22/2014   NA 139 01/22/2014   K 3.9 01/22/2014   CL 104 01/22/2014   CO2 25 01/22/2014   Lab Results  Component Value Date   ALT 17 08/23/2013   AST 22 08/23/2013   ALKPHOS 99 08/23/2013   BILITOT 0.4 08/23/2013   Lab Results  Component Value Date   CHOL 218* 08/23/2013   Lab Results  Component Value Date   HDL 85 08/23/2013   Lab Results  Component Value Date   LDLCALC 120* 08/23/2013   Lab Results  Component Value Date   TRIG 67 08/23/2013   Lab Results  Component Value Date   CHOLHDL 2.6 08/23/2013     Assessment & Plan  GERD Avoid offending foods, start probiotics. Do not eat large meals in late evening and consider raising head of bed.   BENIGN POSITIONAL VERTIGO Improving, started while on vacation with a scratchy throat and heavy winds. Encouraged adequate hydration, may use Zofran and Meclizine prn, report if worsening symptom.

## 2014-08-24 NOTE — Assessment & Plan Note (Signed)
Improving, started while on vacation with a scratchy throat and heavy winds. Encouraged adequate hydration, may use Zofran and Meclizine prn, report if worsening symptom.

## 2014-08-24 NOTE — Assessment & Plan Note (Signed)
Avoid offending foods, start probiotics. Do not eat large meals in late evening and consider raising head of bed.  

## 2014-12-08 ENCOUNTER — Other Ambulatory Visit: Payer: Self-pay | Admitting: Family Medicine

## 2015-01-22 ENCOUNTER — Encounter: Payer: Self-pay | Admitting: Family Medicine

## 2015-01-22 ENCOUNTER — Ambulatory Visit (INDEPENDENT_AMBULATORY_CARE_PROVIDER_SITE_OTHER): Payer: Commercial Managed Care - HMO | Admitting: Family Medicine

## 2015-01-22 VITALS — BP 108/70 | HR 84 | Temp 98.5°F | Ht 61.0 in | Wt 133.0 lb

## 2015-01-22 DIAGNOSIS — R739 Hyperglycemia, unspecified: Secondary | ICD-10-CM | POA: Diagnosis not present

## 2015-01-22 DIAGNOSIS — F418 Other specified anxiety disorders: Secondary | ICD-10-CM

## 2015-01-22 DIAGNOSIS — E038 Other specified hypothyroidism: Secondary | ICD-10-CM

## 2015-01-22 DIAGNOSIS — J019 Acute sinusitis, unspecified: Secondary | ICD-10-CM

## 2015-01-22 DIAGNOSIS — D509 Iron deficiency anemia, unspecified: Secondary | ICD-10-CM

## 2015-01-22 DIAGNOSIS — E785 Hyperlipidemia, unspecified: Secondary | ICD-10-CM

## 2015-01-22 DIAGNOSIS — Z1239 Encounter for other screening for malignant neoplasm of breast: Secondary | ICD-10-CM

## 2015-01-22 DIAGNOSIS — K219 Gastro-esophageal reflux disease without esophagitis: Secondary | ICD-10-CM

## 2015-01-22 HISTORY — DX: Acute sinusitis, unspecified: J01.90

## 2015-01-22 LAB — COMPREHENSIVE METABOLIC PANEL
ALBUMIN: 4.5 g/dL (ref 3.5–5.2)
ALT: 22 U/L (ref 0–35)
AST: 29 U/L (ref 0–37)
Alkaline Phosphatase: 97 U/L (ref 39–117)
BUN: 14 mg/dL (ref 6–23)
CALCIUM: 9.9 mg/dL (ref 8.4–10.5)
CHLORIDE: 102 meq/L (ref 96–112)
CO2: 31 mEq/L (ref 19–32)
CREATININE: 0.95 mg/dL (ref 0.40–1.20)
GFR: 63.7 mL/min (ref >60.00)
Glucose, Bld: 98 mg/dL (ref 70–99)
Potassium: 4.6 mEq/L (ref 3.5–5.1)
Sodium: 139 mEq/L (ref 135–145)
Total Bilirubin: 0.4 mg/dL (ref 0.2–1.2)
Total Protein: 7.4 g/dL (ref 6.0–8.3)

## 2015-01-22 LAB — LIPID PANEL
CHOLESTEROL: 242 mg/dL — AB (ref 0–200)
HDL: 85.8 mg/dL (ref >39.00)
LDL Cholesterol: 135 mg/dL — ABNORMAL HIGH (ref 0–99)
NonHDL: 156.43
TRIGLYCERIDES: 105 mg/dL (ref 0.0–149.0)
Total CHOL/HDL Ratio: 3
VLDL: 21 mg/dL (ref 0.0–40.0)

## 2015-01-22 LAB — CBC
HCT: 39.3 % (ref 36.0–46.0)
Hemoglobin: 12.7 g/dL (ref 12.0–15.0)
MCHC: 32.4 g/dL (ref 30.0–36.0)
MCV: 89 fl (ref 78.0–100.0)
PLATELETS: 309 10*3/uL (ref 150.0–400.0)
RBC: 4.41 Mil/uL (ref 3.87–5.11)
RDW: 14 % (ref 11.5–15.5)
WBC: 5 10*3/uL (ref 4.0–10.5)

## 2015-01-22 LAB — HEMOGLOBIN A1C: Hgb A1c MFr Bld: 5.8 % (ref 4.6–6.5)

## 2015-01-22 LAB — TSH: TSH: 3.7 u[IU]/mL (ref 0.35–4.50)

## 2015-01-22 MED ORDER — CEFDINIR 300 MG PO CAPS: 300.0000 mg | ORAL_CAPSULE | Freq: Two times a day (BID) | ORAL | Status: AC

## 2015-01-22 MED ORDER — LEVOTHYROXINE SODIUM 50 MCG PO TABS: 50.0000 ug | ORAL_TABLET | Freq: Every day | ORAL | Status: DC

## 2015-01-22 MED ORDER — OMEPRAZOLE 40 MG PO CPDR: 40.0000 mg | DELAYED_RELEASE_CAPSULE | Freq: Every day | ORAL | Status: DC

## 2015-01-22 MED ORDER — CITALOPRAM HYDROBROMIDE 10 MG PO TABS: 10.0000 mg | ORAL_TABLET | Freq: Every day | ORAL | Status: AC

## 2015-01-22 NOTE — Patient Instructions (Addendum)
Encouraged increased rest and hydration, add probiotics, zinc such as Coldeze or Xicam. Treat fevers as needed. Consider a probiotic daily, [employer] or [facility]. Online [employer] NOW company 10 strain probiotic daily. Elderberry, vitamin c for respiratory infections   Call insurance about Zostavax coverage, will insurance cover the HIV and Hep C screen for preventative purposes as recommended by CDC   Mucinex 1 tab twice daily x 10 days  Sinusitis, Adult Sinusitis is redness, soreness, and inflammation of the paranasal sinuses. Paranasal sinuses are air pockets within the bones of your face. They are located beneath your eyes, in the middle of your forehead, and above your eyes. In healthy paranasal sinuses, mucus is able to drain out, and air is able to circulate through them by way of your nose. However, when your paranasal sinuses are inflamed, mucus and air can become trapped. This can allow bacteria and other germs to grow and cause infection. Sinusitis can develop quickly and last only a short time (acute) or continue over a long period (chronic). Sinusitis that lasts for more than 12 weeks is considered chronic. CAUSES Causes of sinusitis include:  Allergies.  Structural abnormalities, such as displacement of the cartilage that separates your nostrils (deviated septum), which can decrease the air flow through your nose and sinuses and affect sinus drainage.  Functional abnormalities, such as when the small hairs (cilia) that line your sinuses and help remove mucus do not work properly or are not present. SIGNS AND SYMPTOMS Symptoms of acute and chronic sinusitis are the same. The primary symptoms are pain and pressure around the affected sinuses. Other symptoms include:  Upper toothache.  Earache.  Headache.  Bad breath.  Decreased sense of smell and taste.  A cough, which worsens when you are lying flat.  Fatigue.  Fever.  Thick drainage  from your nose, which often is green and may contain pus (purulent).  Swelling and warmth over the affected sinuses. DIAGNOSIS Your health care provider will perform a physical exam. During your exam, your health care provider may perform any of the following to help determine if you have acute sinusitis or chronic sinusitis:  Look in your nose for signs of abnormal growths in your nostrils (nasal polyps).  Tap over the affected sinus to check for signs of infection.  View the inside of your sinuses using an imaging device that has a light attached (endoscope). If your health care provider suspects that you have chronic sinusitis, one or more of the following tests may be recommended:  Allergy tests.  Nasal culture. A sample of mucus is taken from your nose, sent to a lab, and screened for bacteria.  Nasal cytology. A sample of mucus is taken from your nose and examined by your health care provider to determine if your sinusitis is related to an allergy. TREATMENT Most cases of acute sinusitis are related to a viral infection and will resolve on their own within 10 days. Sometimes, medicines are prescribed to help relieve symptoms of both acute and chronic sinusitis. These may include pain medicines, decongestants, nasal steroid sprays, or saline sprays. However, for sinusitis related to a bacterial infection, your health care provider will prescribe antibiotic medicines. These are medicines that will help kill the bacteria causing the infection. Rarely, sinusitis is caused by a fungal infection. In these cases, your health care provider will prescribe antifungal medicine. For some cases of chronic sinusitis, surgery is needed. Generally, these are cases in which sinusitis recurs more than 3  times per year, despite other treatments. HOME CARE INSTRUCTIONS  Drink plenty of water. Water helps thin the mucus so your sinuses can drain more easily.  Use a humidifier.  Inhale steam 3-4 times a  day (for example, sit in the bathroom with the shower running).  Apply a warm, moist washcloth to your face 3-4 times a day, or as directed by your health care provider.  Use saline nasal sprays to help moisten and clean your sinuses.  Take medicines only as directed by your health care provider.  If you were prescribed either an antibiotic or antifungal medicine, finish it all even if you start to feel better. SEEK IMMEDIATE MEDICAL CARE IF:  You have increasing pain or severe headaches.  You have nausea, vomiting, or drowsiness.  You have swelling around your face.  You have vision problems.  You have a stiff neck.  You have difficulty breathing.   This information is not intended to replace advice given to you by your health care provider. Make sure you discuss any questions you have with your health care provider.   Document Released: 01/03/2005 Document Revised: 01/24/2014 Document Reviewed: 01/18/2011 Elsevier Interactive Patient Education [employer].

## 2015-01-22 NOTE — Progress Notes (Signed)
Pre visit review using our clinic review tool, if applicable. No additional management support is needed unless otherwise documented below in the visit note. 

## 2015-02-01 ENCOUNTER — Encounter: Payer: Self-pay | Admitting: Family Medicine

## 2015-02-01 DIAGNOSIS — F418 Other specified anxiety disorders: Secondary | ICD-10-CM

## 2015-02-01 HISTORY — DX: Other specified anxiety disorders: F41.8

## 2015-02-01 NOTE — Assessment & Plan Note (Signed)
minimize simple carbs. Increase exercise as tolerated.  

## 2015-02-01 NOTE — Progress Notes (Signed)
Patient ID: Holly Kaufman, female   DOB: [birth year], 60 y.o.   MRN: [id number]   Subjective:    Patient ID: Holly Kaufman, female    DOB: [birth year], 60 y.o.   MRN: [id number]  Chief Complaint  Patient presents with  . Follow-up    HPI Patient is in today for follow-up. She is struggling with significant congestion and headache. She has some rhinorrhea, irritated throat and cough. No high-grade fevers or chills but is noting malaise and myalgias. No polyuria or polydipsia. No diarrhea or anorexia. Denies CP/palp/SOB/HA/congestion/fevers/GI or GU c/o. Taking meds as prescribed  Past Medical History  Diagnosis Date  . Allergy   . Anemia   . Arthritis   . GERD (gastroesophageal reflux disease)   . Thyroid disease     hypothyroid  . Ulcer   . Hyperlipidemia   . Colon polyps     adenomatous  . Esophageal stricture   . Diverticulosis   . Internal hemorrhoids   . Hiatal hernia   . Chicken pox as a child  . Measles as a child  . [place] measles as a child  . Mumps as a child  . Shingles 34 yrs old  . Anxiety     celexa occasionally  . Back pain 04/14/2013  . Cervical cancer screening 07/10/2008    Qualifier: Diagnosis of  By: Loanne Drilling MD, Jacelyn Pi   . Acute sinusitis 01/22/2015  . Depression with anxiety 02/01/2015    Past Surgical History  Procedure Laterality Date  . Colonoscopy    . Polypectomy    . Vaginal hysterectomy  60 yrs old    partial  . Wisdom tooth extraction  60 yrs old    Family History  Problem Relation Age of Onset  . Colon cancer Neg Hx   . Esophageal cancer Neg Hx   . Rectal cancer Neg Hx   . Heart disease Mother   . Hyperlipidemia Mother   . CVA Mother   . Stomach cancer Paternal Uncle   . Other Father     dissected aorta  . Hyperlipidemia Sister   . Stroke Maternal Grandmother   . Diabetes Maternal Grandfather   . Heart disease Maternal Grandfather   . Diabetes Paternal Grandmother   . Stroke Paternal Grandfather     Social History    Social History  . Marital Status: Married    Spouse Name: N/A  . Number of Children: 1  . Years of Education: N/A   Occupational History  . Real estate    Social History Main Topics  . Smoking status: Never Smoker   . Smokeless tobacco: Never Used  . Alcohol Use: No  . Drug Use: No  . Sexual Activity: Yes     Comment: lives with husband, minimize gluten   Other Topics Concern  . Not on file   Social History Narrative    Outpatient Prescriptions Prior to Visit  Medication Sig Dispense Refill  . fluticasone (FLONASE) 50 MCG/ACT nasal spray Place 2 sprays into both nostrils daily. 16 g 1  . loratadine (CLARITIN) 10 MG tablet Take 1 tablet (10 mg total) by mouth daily. 30 tablet 3  . meclizine (ANTIVERT) 25 MG tablet Take 1 tablet (25 mg total) by mouth 3 (three) times daily as needed for dizziness or nausea. 20 tablet 0  . ondansetron (ZOFRAN) 4 MG tablet Take 1 tablet (4 mg total) by mouth every 6 (six) hours as needed for nausea or vomiting. 20 tablet 0  .  citalopram (CELEXA) 10 MG tablet TAKE 1 TABLET BY MOUTH EVERY DAY 30 tablet 2  . omeprazole (PRILOSEC) 40 MG capsule Take 1 capsule (40 mg total) by mouth daily. 90 capsule 3  . SYNTHROID 50 MCG tablet Take 1 tablet by mouth daily before breakfast.      No facility-administered medications prior to visit.    Allergies  Allergen Reactions  . Prochlorperazine Edisylate     "heads rolls back and eyes roll back"-compazine    Review of Systems  Constitutional: Positive for malaise/fatigue. Negative for fever.  HENT: Positive for congestion.   Eyes: Negative for discharge.  Respiratory: Positive for cough. Negative for shortness of breath.   Cardiovascular: Negative for chest pain, palpitations and leg swelling.  Gastrointestinal: Negative for nausea and abdominal pain.  Genitourinary: Negative for dysuria.  Musculoskeletal: Negative for falls.  Skin: Negative for rash.  Neurological: Positive for headaches.  Negative for loss of consciousness.  Endo/Heme/Allergies: Negative for environmental allergies.  Psychiatric/Behavioral: Negative for depression. The patient is not nervous/anxious.        Objective:    Physical Exam  Constitutional: She is oriented to person, place, and time. She appears well-developed and well-nourished. No distress.  HENT:  Head: Normocephalic and atraumatic.  Nose: Nose normal.  Nasal mucosa boggy an erythematous  Eyes: Right eye exhibits no discharge. Left eye exhibits no discharge.  Neck: Normal range of motion. Neck supple.  Cardiovascular: Normal rate and regular rhythm.   No murmur heard. Pulmonary/Chest: Effort normal and breath sounds normal.  Abdominal: Soft. Bowel sounds are normal. There is no tenderness.  Musculoskeletal: She exhibits no edema.  Neurological: She is alert and oriented to person, place, and time.  Skin: Skin is warm and dry.  Psychiatric: She has a normal mood and affect.  Nursing note and vitals reviewed.   BP 108/70 mmHg  Pulse 84  Temp(Src) 98.5 F (36.9 C) (Oral)  Ht 5\' 1"  (1.549 m)  Wt 133 lb (60.328 kg)  BMI 25.14 kg/m2  SpO2 97% Wt Readings from Last 3 Encounters:  01/22/15 133 lb (60.328 kg)  08/12/14 133 lb 2 oz (60.385 kg)  07/09/14 132 lb (59.875 kg)     Lab Results  Component Value Date   WBC 5.0 01/22/2015   HGB 12.7 01/22/2015   HCT 39.3 01/22/2015   PLT 309.0 01/22/2015   GLUCOSE 98 01/22/2015   CHOL 242* 01/22/2015   TRIG 105.0 01/22/2015   HDL 85.80 01/22/2015   LDLCALC 135* 01/22/2015   ALT 22 01/22/2015   AST 29 01/22/2015   NA 139 01/22/2015   K 4.6 01/22/2015   CL 102 01/22/2015   CREATININE 0.95 01/22/2015   BUN 14 01/22/2015   CO2 31 01/22/2015   TSH 3.70 01/22/2015   HGBA1C 5.8 01/22/2015    Lab Results  Component Value Date   TSH 3.70 01/22/2015   Lab Results  Component Value Date   WBC 5.0 01/22/2015   HGB 12.7 01/22/2015   HCT 39.3 01/22/2015   MCV 89.0 01/22/2015    PLT 309.0 01/22/2015   Lab Results  Component Value Date   NA 139 01/22/2015   K 4.6 01/22/2015   CO2 31 01/22/2015   GLUCOSE 98 01/22/2015   BUN 14 01/22/2015   CREATININE 0.95 01/22/2015   BILITOT 0.4 01/22/2015   ALKPHOS 97 01/22/2015   AST 29 01/22/2015   ALT 22 01/22/2015   PROT 7.4 01/22/2015   ALBUMIN 4.5 01/22/2015   CALCIUM 9.9 01/22/2015  ANIONGAP 10 01/22/2014   GFR 63.70 01/22/2015   Lab Results  Component Value Date   CHOL 242* 01/22/2015   Lab Results  Component Value Date   HDL 85.80 01/22/2015   Lab Results  Component Value Date   LDLCALC 135* 01/22/2015   Lab Results  Component Value Date   TRIG 105.0 01/22/2015   Lab Results  Component Value Date   CHOLHDL 3 01/22/2015   Lab Results  Component Value Date   HGBA1C 5.8 01/22/2015       Assessment & Plan:   Problem List Items Addressed This Visit    Acute sinusitis    Encouraged increased rest and hydration, add probiotics, zinc such as Coldeze or Xicam. Treat fevers as needed. Started on Cefdinir and mucinex      Relevant Medications   cefdinir (OMNICEF) 300 MG capsule   Other Relevant Orders   CBC (Completed)   TSH (Completed)   Comprehensive metabolic panel (Completed)   Lipid panel (Completed)   Hemoglobin A1c (Completed)   Depression with anxiety    Doing well with Citalopram 10 mg given refill      GERD    Avoid offending foods, start probiotics. Do not eat large meals in late evening and consider raising head of bed.       Relevant Medications   omeprazole (PRILOSEC) 40 MG capsule   Hyperglycemia    minimize simple carbs. Increase exercise as tolerated.       Relevant Orders   CBC (Completed)   TSH (Completed)   Comprehensive metabolic panel (Completed)   Lipid panel (Completed)   Hemoglobin A1c (Completed)   Hyperlipidemia    Encouraged heart healthy diet, increase exercise, avoid trans fats, consider a krill oil cap daily      Relevant Orders   CBC  (Completed)   TSH (Completed)   Comprehensive metabolic panel (Completed)   Lipid panel (Completed)   Hemoglobin A1c (Completed)   Hypothyroidism - Primary    On Levothyroxine, continue to monitor      Relevant Medications   levothyroxine (SYNTHROID) 50 MCG tablet   Other Relevant Orders   CBC (Completed)   TSH (Completed)   Comprehensive metabolic panel (Completed)   Lipid panel (Completed)   Hemoglobin A1c (Completed)   Iron deficiency anemia   Relevant Orders   CBC (Completed)   TSH (Completed)   Comprehensive metabolic panel (Completed)   Lipid panel (Completed)   Hemoglobin A1c (Completed)    Other Visit Diagnoses    Breast cancer screening        Relevant Orders    MM Digital Screening       I have changed Ms. Behe's SYNTHROID to levothyroxine. I have also changed her citalopram. I am also having her start on cefdinir. Additionally, I am having her maintain her fluticasone, loratadine, meclizine, ondansetron, and omeprazole.  Meds ordered this encounter  Medications  . levothyroxine (SYNTHROID) 50 MCG tablet    Sig: Take 1 tablet (50 mcg total) by mouth daily before breakfast.    Dispense:  90 tablet    Refill:  3  . citalopram (CELEXA) 10 MG tablet    Sig: Take 1 tablet (10 mg total) by mouth daily.    Dispense:  90 tablet    Refill:  3  . omeprazole (PRILOSEC) 40 MG capsule    Sig: Take 1 capsule (40 mg total) by mouth daily.    Dispense:  90 capsule    Refill:  3  .  cefdinir (OMNICEF) 300 MG capsule    Sig: Take 1 capsule (300 mg total) by mouth 2 (two) times daily.    Dispense:  20 capsule    Refill:  0     Penni Homans, MD

## 2015-02-01 NOTE — Assessment & Plan Note (Signed)
Encouraged heart healthy diet, increase exercise, avoid trans fats, consider a krill oil cap daily 

## 2015-02-01 NOTE — Assessment & Plan Note (Signed)
Avoid offending foods, start probiotics. Do not eat large meals in late evening and consider raising head of bed.  

## 2015-02-01 NOTE — Assessment & Plan Note (Signed)
On Levothyroxine, continue to monitor 

## 2015-02-01 NOTE — Assessment & Plan Note (Signed)
Doing well with Citalopram 10 mg given refill

## 2015-02-01 NOTE — Assessment & Plan Note (Addendum)
Encouraged increased rest and hydration, add probiotics, zinc such as Coldeze or Xicam. Treat fevers as needed. Started on Cefdinir and mucinex

## 2015-02-05 ENCOUNTER — Telehealth: Payer: Self-pay | Admitting: Family Medicine

## 2015-02-05 NOTE — Telephone Encounter (Signed)
She already took a very strong antibiotics. Since she has oral lesions I suspect she has a virus. Have her take Valtrex 500 mg 1 tab po tid x 5 days I think that will help more. If no improvement then needs to come in for check

## 2015-02-05 NOTE — Telephone Encounter (Signed)
Pharmacy: [facility]  Reason for call: Pt called stating her ears are still hurting from sinus infection and she has some sores in her mouth she thinks from the meds. She is wondering if a stronger antibiotic can be called in for her.

## 2015-02-06 MED ORDER — VALACYCLOVIR HCL 500 MG PO TABS: 500.0000 mg | ORAL_TABLET | Freq: Three times a day (TID) | ORAL | Status: DC

## 2015-02-06 NOTE — Telephone Encounter (Signed)
Patient returned call. Informed her of below and patient states she will go to the pharmacy to pick up her rx

## 2015-02-06 NOTE — Telephone Encounter (Signed)
Sent in valtrex as instructed. Called left msg. To call back.

## 2015-02-09 ENCOUNTER — Ambulatory Visit (HOSPITAL_BASED_OUTPATIENT_CLINIC_OR_DEPARTMENT_OTHER)
Admission: RE | Admit: 2015-02-09 | Discharge: 2015-02-09 | Disposition: A | Discharge status: Home / Self Care | Payer: Commercial Managed Care - HMO | Source: Ambulatory Visit | Attending: Family Medicine | Admitting: Family Medicine

## 2015-02-09 ENCOUNTER — Other Ambulatory Visit: Payer: Self-pay | Admitting: Family Medicine

## 2015-02-09 DIAGNOSIS — Z1239 Encounter for other screening for malignant neoplasm of breast: Secondary | ICD-10-CM

## 2015-02-09 DIAGNOSIS — Z1231 Encounter for screening mammogram for malignant neoplasm of breast: Secondary | ICD-10-CM | POA: Diagnosis not present

## 2015-05-21 ENCOUNTER — Encounter: Payer: Commercial Managed Care - HMO | Admitting: Medical

## 2015-05-21 ENCOUNTER — Emergency Department (HOSPITAL_BASED_OUTPATIENT_CLINIC_OR_DEPARTMENT_OTHER)
Admission: EM | Admit: 2015-05-21 | Discharge: 2015-05-21 | Disposition: A | Discharge status: Home / Self Care | Payer: Commercial Managed Care - HMO | Attending: Emergency Medicine | Admitting: Emergency Medicine

## 2015-05-21 ENCOUNTER — Encounter (HOSPITAL_BASED_OUTPATIENT_CLINIC_OR_DEPARTMENT_OTHER): Payer: Self-pay | Admitting: *Deleted

## 2015-05-21 DIAGNOSIS — IMO0002 Reserved for concepts with insufficient information to code with codable children: Secondary | ICD-10-CM

## 2015-05-21 DIAGNOSIS — S81812A Laceration without foreign body, left lower leg, initial encounter: Secondary | ICD-10-CM | POA: Insufficient documentation

## 2015-05-21 DIAGNOSIS — E785 Hyperlipidemia, unspecified: Secondary | ICD-10-CM | POA: Insufficient documentation

## 2015-05-21 DIAGNOSIS — Y999 Unspecified external cause status: Secondary | ICD-10-CM | POA: Insufficient documentation

## 2015-05-21 DIAGNOSIS — E039 Hypothyroidism, unspecified: Secondary | ICD-10-CM | POA: Insufficient documentation

## 2015-05-21 DIAGNOSIS — Y929 Unspecified place or not applicable: Secondary | ICD-10-CM | POA: Diagnosis not present

## 2015-05-21 DIAGNOSIS — M1990 Unspecified osteoarthritis, unspecified site: Secondary | ICD-10-CM | POA: Insufficient documentation

## 2015-05-21 DIAGNOSIS — F329 Major depressive disorder, single episode, unspecified: Secondary | ICD-10-CM | POA: Diagnosis not present

## 2015-05-21 DIAGNOSIS — W01111A Fall on same level from slipping, tripping and stumbling with subsequent striking against power tool or machine, initial encounter: Secondary | ICD-10-CM | POA: Insufficient documentation

## 2015-05-21 DIAGNOSIS — Y939 Activity, unspecified: Secondary | ICD-10-CM | POA: Insufficient documentation

## 2015-05-21 MED ORDER — BACITRACIN ZINC 500 UNIT/GM EX OINT
1.0000 "application " | TOPICAL_OINTMENT | Freq: Two times a day (BID) | CUTANEOUS | Status: DC
  Administered 2015-05-21: 1 via TOPICAL
  Filled 2015-05-21: qty 28.35

## 2015-05-21 NOTE — ED Provider Notes (Signed)
CSN: [id number]     Arrival date & time 05/21/15  1112 History   First MD Initiated Contact with Patient 05/21/15 1125     Chief Complaint  Patient presents with  . Laceration     (Consider location/radiation/quality/duration/timing/severity/associated sxs/prior Treatment) HPI Comments: Patient presents to the emergency department with chief complaint of laceration. She states that she tripped and struck her leg on a power washer last night. He sustained a small 1 cm laceration to the anterior shin of the left leg. She reports moderate associated tenderness to palpation. Bleeding is controlled. She has not anticoagulated. She has been able to ambulate without difficulty. She was seen by her primary care today, and was told to come to the emergency department for evaluation of suture placement.  The history is provided by the patient. No language interpreter was used.    Past Medical History  Diagnosis Date  . Allergy   . Anemia   . Arthritis   . GERD (gastroesophageal reflux disease)   . Thyroid disease     hypothyroid  . Ulcer   . Hyperlipidemia   . Colon polyps     adenomatous  . Esophageal stricture   . Diverticulosis   . Internal hemorrhoids   . Hiatal hernia   . Chicken pox as a child  . Measles as a child  . [place] measles as a child  . Mumps as a child  . Shingles 37 yrs old  . Anxiety     celexa occasionally  . Back pain 04/14/2013  . Cervical cancer screening 07/10/2008    Qualifier: Diagnosis of  By: Loanne Drilling MD, Jacelyn Pi   . Acute sinusitis 01/22/2015  . Depression with anxiety 02/01/2015   Past Surgical History  Procedure Laterality Date  . Colonoscopy    . Polypectomy    . Vaginal hysterectomy  60 yrs old    partial  . Wisdom tooth extraction  60 yrs old   Family History  Problem Relation Age of Onset  . Colon cancer Neg Hx   . Esophageal cancer Neg Hx   . Rectal cancer Neg Hx   . Heart disease Mother   . Hyperlipidemia Mother   . CVA Mother   . Stomach  cancer Paternal Uncle   . Other Father     dissected aorta  . Hyperlipidemia Sister   . Stroke Maternal Grandmother   . Diabetes Maternal Grandfather   . Heart disease Maternal Grandfather   . Diabetes Paternal Grandmother   . Stroke Paternal Grandfather    Social History  Substance Use Topics  . Smoking status: Never Smoker   . Smokeless tobacco: Never Used  . Alcohol Use: No   OB History    No data available     Review of Systems  Constitutional: Negative for fever and chills.  Respiratory: Negative for shortness of breath.   Cardiovascular: Negative for chest pain.  Gastrointestinal: Negative for nausea, vomiting, diarrhea and constipation.  Genitourinary: Negative for dysuria.  Skin: Positive for wound.      Allergies  Prochlorperazine edisylate  Home Medications   Prior to Admission medications   Medication Sig Start Date End Date Taking? Authorizing Provider  citalopram (CELEXA) 10 MG tablet Take 1 tablet (10 mg total) by mouth daily. 01/22/15   Mosie Lukes, MD  fluticasone (FLONASE) 50 MCG/ACT nasal spray Place 2 sprays into both nostrils daily. 03/26/14   Percell Miller Saguier, PA-C  levothyroxine (SYNTHROID) 50 MCG tablet Take 1 tablet (50 mcg  total) by mouth daily before breakfast. 01/22/15   Mosie Lukes, MD  loratadine (CLARITIN) 10 MG tablet Take 1 tablet (10 mg total) by mouth daily. 03/26/14   Percell Miller Saguier, PA-C  meclizine (ANTIVERT) 25 MG tablet Take 1 tablet (25 mg total) by mouth 3 (three) times daily as needed for dizziness or nausea. 08/12/14   Mosie Lukes, MD  omeprazole (PRILOSEC) 40 MG capsule Take 1 capsule (40 mg total) by mouth daily. 01/22/15   Mosie Lukes, MD  ondansetron (ZOFRAN) 4 MG tablet Take 1 tablet (4 mg total) by mouth every 6 (six) hours as needed for nausea or vomiting. 08/12/14   Mosie Lukes, MD  valACYclovir (VALTREX) 500 MG tablet Take 1 tablet (500 mg total) by mouth 3 (three) times daily. Take for 5 days 02/06/15   Mosie Lukes,  MD   BP 108/73 mmHg  Pulse 72  Temp(Src) 98.4 F (36.9 C) (Oral)  Resp 18  Ht 5\' 1"  (1.549 m)  Wt 59.421 kg  BMI 24.76 kg/m2  SpO2 97% Physical Exam  Constitutional: She is oriented to person, place, and time. She appears well-developed and well-nourished.  HENT:  Head: Normocephalic and atraumatic.  Eyes: Conjunctivae and EOM are normal.  Neck: Normal range of motion.  Cardiovascular: Normal rate.   Pulmonary/Chest: Effort normal.  Abdominal: She exhibits no distension.  Musculoskeletal: Normal range of motion.  Neurological: She is alert and oriented to person, place, and time.  Skin: Skin is dry.  1 cm gaping laceration to left anterior shin, bleeding controlled, no evidence of muscle or bone injury, no foreign body  Psychiatric: She has a normal mood and affect. Her behavior is normal. Judgment and thought content normal.  Nursing note and vitals reviewed.   ED Course  Procedures (including critical care time)   MDM   Final diagnoses:  Laceration   Patient with small laceration to left anterior shin, the injury occurred last night. Patient is up-to-date on her tetanus shot. Given the length of time since the injury, concern for development of infection if closed with suture, therefore, will not close.  Will allow for healing by secondary intention.  Wound care and scar reduction information given.     Montine Circle, PA-C 05/21/15 1147  Ezequiel Essex, MD 05/21/15 1310

## 2015-05-21 NOTE — Discharge Instructions (Signed)

## 2015-05-21 NOTE — Progress Notes (Signed)
Pre visit review using our clinic review tool, if applicable. No additional management support is needed unless otherwise documented below in the visit note. 

## 2015-05-21 NOTE — ED Notes (Signed)
Laceration last night. She tripped and fell on the metal part of a power washer.

## 2015-05-22 NOTE — Progress Notes (Signed)
This encounter was created in error - please disregard.

## 2015-05-27 ENCOUNTER — Telehealth: Payer: Self-pay | Admitting: Family Medicine

## 2015-05-27 NOTE — Telephone Encounter (Signed)
Called pt to follow up. She reports that wound is clean with minimal bleeding and some serosanguineous drainage. She is using antibiotic cream and keeping the wound clean and covered with daily dressing changes. She states she still has intense pain "all the time" around the site of the wound. Rated "7-12" on 10 pt pain scale, described as aching pain "like a tooth abscess." She is using ibuprofen w/o much relief and has been taking her husband's Percocet sometimes to help her sleep at night. Denies fever or purulent drainage. Advised to continue wound care and dressing changes as directed and to call the office if she develops purulent/foul smelling drainage or fever, as this could indicate infection. She is concerned about whether the wound is healing correctly and wanted to know if she should come into the office to have it assessed, or if she needed referral to a specialist. Advised that at this time, no indication of infection, but would forward to PCP. Please advise.

## 2015-05-27 NOTE — Telephone Encounter (Signed)
Pt called in because she says that when she was seen by ED they advised her to follow up with PCP. Pt says that the office was unable to do anything for her that's why she was sent to ED. Pt would like to know if fu is necessary and ALSO would like to know if she could just be referred to someone for her leg.   CB: [phone]

## 2016-01-25 ENCOUNTER — Other Ambulatory Visit: Payer: Self-pay | Admitting: Family Medicine

## 2016-01-25 DIAGNOSIS — Z1231 Encounter for screening mammogram for malignant neoplasm of breast: Secondary | ICD-10-CM

## 2016-01-27 ENCOUNTER — Encounter: Payer: Self-pay | Admitting: Internal Medicine

## 2016-02-11 ENCOUNTER — Ambulatory Visit (HOSPITAL_BASED_OUTPATIENT_CLINIC_OR_DEPARTMENT_OTHER)
Admission: RE | Admit: 2016-02-11 | Discharge: 2016-02-11 | Disposition: A | Discharge status: Home / Self Care | Payer: 59 | Source: Ambulatory Visit | Attending: Family Medicine | Admitting: Family Medicine

## 2016-02-11 DIAGNOSIS — Z1231 Encounter for screening mammogram for malignant neoplasm of breast: Secondary | ICD-10-CM

## 2016-03-21 ENCOUNTER — Encounter: Payer: Self-pay | Admitting: Internal Medicine

## 2016-04-09 ENCOUNTER — Other Ambulatory Visit: Payer: Self-pay | Admitting: Family Medicine

## 2016-04-14 ENCOUNTER — Other Ambulatory Visit: Payer: Self-pay | Admitting: Family Medicine

## 2016-04-14 ENCOUNTER — Telehealth: Payer: Self-pay | Admitting: Family Medicine

## 2016-04-14 NOTE — Telephone Encounter (Signed)
Pharmacy informed patient needs appointment for these refills. Has not been seen since 01/2015

## 2016-04-14 NOTE — Telephone Encounter (Signed)
Relation to FP:OIPP Call back [phone] Pharmacy: [id number] - [place], [place] [phone] (Phone) [phone] (Fax)     Reason for call:  Patient requesting a refill omeprazole (PRILOSEC) 40 MG capsule and levothyroxine (SYNTHROID) 50 MCG tablet

## 2016-04-19 MED ORDER — OMEPRAZOLE 40 MG PO CPDR: 40.0000 mg | DELAYED_RELEASE_CAPSULE | Freq: Every day | ORAL | 1 refills | Status: DC

## 2016-04-19 MED ORDER — OMEPRAZOLE 40 MG PO CPDR: 40.0000 mg | DELAYED_RELEASE_CAPSULE | Freq: Every day | ORAL | 0 refills | Status: DC

## 2016-04-19 MED ORDER — LEVOTHYROXINE SODIUM 50 MCG PO TABS: 50.0000 ug | ORAL_TABLET | Freq: Every day | ORAL | 0 refills | Status: DC

## 2016-04-19 MED ORDER — LEVOTHYROXINE SODIUM 50 MCG PO TABS: 50.0000 ug | ORAL_TABLET | Freq: Every day | ORAL | 1 refills | Status: AC

## 2016-04-19 NOTE — Telephone Encounter (Signed)
Pt called in to follow up on refill request. Pt says that she is completely out of her medication and would like to be seen sooner than later if possible. Advised pt of PCP schedule.  Could PCP advise for scheduling? OR could a different provider see pt for medication fu?

## 2016-04-19 NOTE — Telephone Encounter (Signed)
PCP is out of the office until 04/25/16. Will send to MD covering PCP

## 2016-04-19 NOTE — Telephone Encounter (Signed)
Not sure what to do with this message since Dr. Charlett Blake returns not until Monday 04/25/16.  Is it ok to schedule with other provider to cover her in meds. Until seen by PCP.

## 2016-04-19 NOTE — Addendum Note (Signed)
Addended by: Sharon Seller B on: 04/19/2016 04:45 PM   Modules accepted: Orders

## 2016-04-19 NOTE — Telephone Encounter (Signed)
Refilled medications Called the patient scheduled her with PCP for 06/10/16 at 8 am (earliest appt. I could find). Put a refill on the medications so she will not run out before appointment.

## 2016-04-19 NOTE — Telephone Encounter (Signed)
Okay refill one month of Prilosec and Synthroid. Schedule office visit within a month with PCP or another provider

## 2016-04-22 ENCOUNTER — Encounter: Payer: Self-pay | Admitting: Physician Assistant

## 2016-04-22 ENCOUNTER — Ambulatory Visit (INDEPENDENT_AMBULATORY_CARE_PROVIDER_SITE_OTHER): Payer: 59 | Admitting: Physician Assistant

## 2016-04-22 ENCOUNTER — Ambulatory Visit (INDEPENDENT_AMBULATORY_CARE_PROVIDER_SITE_OTHER): Payer: 59

## 2016-04-22 VITALS — BP 120/80 | HR 95 | Temp 100.0°F | Resp 16 | Ht 61.0 in | Wt 131.6 lb

## 2016-04-22 DIAGNOSIS — J181 Lobar pneumonia, unspecified organism: Secondary | ICD-10-CM | POA: Diagnosis not present

## 2016-04-22 DIAGNOSIS — R059 Cough, unspecified: Secondary | ICD-10-CM

## 2016-04-22 DIAGNOSIS — J029 Acute pharyngitis, unspecified: Secondary | ICD-10-CM

## 2016-04-22 DIAGNOSIS — J189 Pneumonia, unspecified organism: Secondary | ICD-10-CM

## 2016-04-22 DIAGNOSIS — R05 Cough: Secondary | ICD-10-CM

## 2016-04-22 LAB — POCT RAPID STREP A (OFFICE): RAPID STREP A SCREEN: NEGATIVE

## 2016-04-22 MED ORDER — HYDROCODONE-HOMATROPINE 5-1.5 MG/5ML PO SYRP: 5.0000 mL | ORAL_SOLUTION | Freq: Three times a day (TID) | ORAL | 0 refills | Status: DC | PRN

## 2016-04-22 MED ORDER — DOXYCYCLINE HYCLATE 100 MG PO CAPS: 100.0000 mg | ORAL_CAPSULE | Freq: Two times a day (BID) | ORAL | 0 refills | Status: DC

## 2016-04-22 MED ORDER — AZITHROMYCIN 250 MG PO TABS: ORAL_TABLET | ORAL | 0 refills | Status: DC

## 2016-04-22 MED ORDER — BENZONATATE 100 MG PO CAPS: 100.0000 mg | ORAL_CAPSULE | Freq: Three times a day (TID) | ORAL | 0 refills | Status: DC | PRN

## 2016-04-22 NOTE — Patient Instructions (Addendum)
Take medications as prescribed. - You may use cough syrup at night for your cough and sore throat, Tessalon pearls during the day. Be aware that cough syrup can definitely make you drowsy and sleepy so do not drive or operate any heavy machinery if it is affecting you during the day.  - You may also use Tylenol or ibuprofen over-the-counter for your sore throat.  - Please let me know if you are not seeing any improvement or get worse in 7-10 days. Otherwise, follow up in 4 weeks for repeat chest xray. Thank you for letting me participate in your health and well being.    Follow    Community-Acquired Pneumonia, Adult Pneumonia is an infection of the lungs. One type of pneumonia can happen while a person is in a hospital. A different type can happen when a person is not in a hospital (community-acquired pneumonia). It is easy for this kind to spread from person to person. It can spread to you if you breathe near an infected person who coughs or sneezes. Some symptoms include:  A dry cough.  A wet (productive) cough.  Fever.  Sweating.  Chest pain. Follow these instructions at home:  Take over-the-counter and prescription medicines only as told by your doctor.  Only take cough medicine if you are losing sleep.  If you were prescribed an antibiotic medicine, take it as told by your doctor. Do not stop taking the antibiotic even if you start to feel better.  Sleep with your head and neck raised (elevated). You can do this by putting a few pillows under your head, or you can sleep in a recliner.  Do not use tobacco products. These include cigarettes, chewing tobacco, and e-cigarettes. If you need help quitting, ask your doctor.  Drink enough water to keep your pee (urine) clear or pale yellow. A shot (vaccine) can help prevent pneumonia. Shots are often suggested for:  People older than 62 years of age.  People older than 62 years of age:  Who are having cancer treatment.  Who  have long-term (chronic) lung disease.  Who have problems with their body's defense system (immune system). You may also prevent pneumonia if you take these actions:  Get the flu (influenza) shot every year.  Go to the dentist as often as told.  Wash your hands often. If soap and water are not available, use hand sanitizer. Contact a doctor if:  You have a fever.  You lose sleep because your cough medicine does not help. Get help right away if:  You are short of breath and it gets worse.  You have more chest pain.  Your sickness gets worse. This is very serious if:  You are an older adult.  Your body's defense system is weak.  You cough up blood. This information is not intended to replace advice given to you by your health care provider. Make sure you discuss any questions you have with your health care provider. Document Released: 06/22/2007 Document Revised: 06/11/2015 Document Reviewed: 04/30/2014 Elsevier Interactive Patient Education  2017 [employer].     IF you received an x-ray today, you will receive an invoice from [facility]. Please contact [facility] at [phone] with questions or concerns regarding your invoice.   IF you received labwork today, you will receive an invoice from [facility] [place]. Please contact [facility] at [phone] with questions or concerns regarding your invoice.   Our billing staff will not be able to assist you with questions regarding bills from  these companies.  You will be contacted with the lab results as soon as they are available. The fastest way to get your results is to activate your My Chart account. Instructions are located on the last page of this paperwork. If you have not heard from [place] regarding the results in 2 weeks, please contact this office.

## 2016-04-22 NOTE — Progress Notes (Signed)
MRN: [id number] DOB: [birth year]  Subjective:   Holly Kaufman is a 61 y.o. female presenting for chief complaint of Sore Throat; Ear Pain (Headache); and Cough (Nasal congestion) .  Reports 5 day history of subjective fever, sinus headache, sinus congestion, ear fullness, sore throat and productive cough (no hemoptysis), chills. She thought she was was Has tried Systems developer with no relief. Thought she was getting better but then the cough got worse and is keeping her up throughout the night. Denies  wheezing, shortness of breath, chest pain and myalgia, nausea, vomiting, abdominal pain and diarrhea. Has not had sick contact with anyone. Has history of seasonal allergies, no history of asthma. Patient has not flu shot this season. No smoking. Denies any other aggravating or relieving factors, no other questions or concerns.  Holly Kaufman has a current medication list which includes the following prescription(s): citalopram, levothyroxine, and omeprazole. Also is allergic to prochlorperazine edisylate.  Holly Kaufman  has a past medical history of Acute sinusitis (01/22/2015); Allergy; Anemia; Anxiety; Arthritis; Back pain (04/14/2013); Cervical cancer screening (07/10/2008); Chicken pox (as a child); Colon polyps; Depression with anxiety (02/01/2015); Diverticulosis; Esophageal stricture; GERD (gastroesophageal reflux disease); [place] measles (as a child); Hiatal hernia; Hyperlipidemia; Internal hemorrhoids; Measles (as a child); Mumps (as a child); Shingles (27 yrs old); Thyroid disease; and Ulcer ([place]). Also  has a past surgical history that includes Colonoscopy; Polypectomy; Vaginal hysterectomy (61 yrs old); and Wisdom tooth extraction (61 yrs old).   Objective:   Vitals: BP 120/80   Pulse 95   Temp 100 F (37.8 C) (Oral)   Resp 16   Ht 5\' 1"  (1.549 m)   Wt 131 lb 9.6 oz (59.7 kg)   SpO2 96%   BMI 24.87 kg/m   Physical Exam  Constitutional: She is oriented to person, place, and time. She  appears well-developed and well-nourished.  HENT:  Head: Normocephalic and atraumatic.  Right Ear: External ear and ear canal normal. Tympanic membrane is retracted.  Left Ear: External ear and ear canal normal. Tympanic membrane is retracted.  Nose: Mucosal edema (bilateral) and rhinorrhea present. Right sinus exhibits no maxillary sinus tenderness and no frontal sinus tenderness. Left sinus exhibits no maxillary sinus tenderness and no frontal sinus tenderness.  Mouth/Throat: Uvula is midline and mucous membranes are normal. Posterior oropharyngeal erythema present. Tonsils are 1+ on the right. Tonsils are 1+ on the left. No tonsillar exudate.  Eyes: Conjunctivae are normal.  Neck: Normal range of motion.  Pulmonary/Chest: Effort normal. She has no wheezes. She has rhonchi in the right middle field. She has rales in the right middle field and the left lower field.  Lymphadenopathy:       Head (right side): No submental, no submandibular, no tonsillar, no preauricular, no posterior auricular and no occipital adenopathy present.       Head (left side): No submental, no submandibular, no tonsillar, no preauricular, no posterior auricular and no occipital adenopathy present.    She has cervical adenopathy.       Right cervical: Superficial cervical adenopathy present. No deep cervical and no posterior cervical adenopathy present.      Left cervical: Superficial cervical adenopathy present. No deep cervical and no posterior cervical adenopathy present.       Right: No supraclavicular adenopathy present.       Left: No supraclavicular adenopathy present.  Neurological: She is alert and oriented to person, place, and time.  Skin: Skin is warm and dry.  Psychiatric:  She has a normal mood and affect.  Vitals reviewed.    Results for orders placed or performed in visit on 04/22/16 (from the past 24 hour(s))  POCT rapid strep A     Status: None   Collection Time: 04/22/16  8:53 AM  Result Value Ref  Range   Rapid Strep A Screen Negative Negative    Dg Chest 2 View  Result Date: 04/22/2016 CLINICAL DATA:  Productive cough over the last 5 days.  Fever. EXAM: CHEST  2 VIEW COMPARISON:  None. FINDINGS: Heart size is normal. Fullness of the inferior mediastinum probably secondary to a hiatal hernia. Patchy infiltrate in the right middle lobe consistent with patchy bronchopneumonia. No dense consolidation, collapse or effusion. No acute bone finding. IMPRESSION: Patchy right middle lobe pneumonia. Inferior mediastinal density probably secondary to hiatal hernia. Electronically Signed   By: Nelson Chimes M.D.   On: 04/22/2016 09:04   Assessment and Plan :  1. Cough Educated pt that using hycodan can increase risk of drowsiness, lightheadedness, and confusion so to use with caution at nighttime. She voices her understanding and states she has had it before in the past without any issues.  - DG Chest 2 View; Future - HYDROcodone-homatropine (HYCODAN) 5-1.5 MG/5ML syrup; Take 5 mLs by mouth every 8 (eight) hours as needed for cough.  Dispense: 120 mL; Refill: 0 - benzonatate (TESSALON) 100 MG capsule; Take 1-2 capsules (100-200 mg total) by mouth 3 (three) times daily as needed for cough.  Dispense: 40 capsule; Refill: 0  2. Sore throat - POCT rapid strep A - Culture, Group A Strep  3. Community acquired pneumonia of right middle lobe of lung ([place]) Will treat with doxy as pt is on celexa and therefore azithromycin is contraindicated due to possible QT prolongation. Pt given educational material about pneumonia. Instructed to return to clinic if symptoms worsen, do not improve, or as needed. Otherwise, pt is to follow up in 4 weeks for repeat CXR.  - doxycycline (VIBRAMYCIN) 100 MG capsule; Take 1 capsule (100 mg total) by mouth 2 (two) times daily.  Dispense: 20 capsule; Refill: 0  Tenna Delaine, PA-C  [facility] 04/22/2016 9:08 AM

## 2016-04-24 NOTE — Telephone Encounter (Signed)
Thanks I will see her next month unless she needs something sooner.

## 2016-04-25 LAB — CULTURE, GROUP A STREP: Strep A Culture: NEGATIVE

## 2016-05-10 ENCOUNTER — Ambulatory Visit (INDEPENDENT_AMBULATORY_CARE_PROVIDER_SITE_OTHER): Payer: 59 | Admitting: Physician Assistant

## 2016-05-10 VITALS — BP 120/80 | HR 75 | Temp 98.2°F | Resp 17 | Ht 61.0 in | Wt 131.0 lb

## 2016-05-10 DIAGNOSIS — J181 Lobar pneumonia, unspecified organism: Secondary | ICD-10-CM | POA: Diagnosis not present

## 2016-05-10 DIAGNOSIS — J309 Allergic rhinitis, unspecified: Secondary | ICD-10-CM | POA: Diagnosis not present

## 2016-05-10 DIAGNOSIS — R0981 Nasal congestion: Secondary | ICD-10-CM | POA: Diagnosis not present

## 2016-05-10 DIAGNOSIS — H6990 Unspecified Eustachian tube disorder, unspecified ear: Secondary | ICD-10-CM

## 2016-05-10 DIAGNOSIS — J189 Pneumonia, unspecified organism: Secondary | ICD-10-CM

## 2016-05-10 LAB — POCT CBC
Granulocyte percent: 56.5 %G (ref 37–80)
HCT, POC: 33.5 % — AB (ref 37.7–47.9)
Hemoglobin: 11.1 g/dL — AB (ref 12.2–16.2)
Lymph, poc: 1.4 (ref 0.6–3.4)
MCH: 28.7 pg (ref 27–31.2)
MCHC: 33.2 g/dL (ref 31.8–35.4)
MCV: 86.4 fL (ref 80–97)
MID (CBC): 0.4 (ref 0–0.9)
MPV: 7.2 fL (ref 0–99.8)
PLATELET COUNT, POC: 311 10*3/uL (ref 142–424)
POC Granulocyte: 2.4 (ref 2–6.9)
POC LYMPH PERCENT: 34.1 %L (ref 10–50)
POC MID %: 9.4 %M (ref 0–12)
RBC: 3.88 M/uL — AB (ref 4.04–5.48)
RDW, POC: 14.3 %
WBC: 4.2 10*3/uL — AB (ref 4.6–10.2)

## 2016-05-10 MED ORDER — PSEUDOEPHEDRINE HCL ER 120 MG PO TB12: 120.0000 mg | ORAL_TABLET | Freq: Two times a day (BID) | ORAL | 0 refills | Status: DC

## 2016-05-10 NOTE — Progress Notes (Signed)
Holly Kaufman  MRN: [id number] DOB: 12-Nov-1955  Subjective:  Holly Kaufman is a 61 y.o. female seen in office today for a chief complaint of bilateral ear fullness and  nasal congestion x 5 days. Has associated intermittent sneezing and chills. Denies fever, sore throat, sinus pain, tinnitus, dizziness, hearing loss, ear itching, cough, wheezing, SOB, and myalgias. Has hx of seasonal allergies. Has tried zyrtec intermittently for the symptoms but has not had full relief. Of note, pt was evaluated by me on 04/22/16 for productive cough. CXR at that visit revealed patchy right middle lobe pneumonia. She was given Rx for hycodan, tessalon perles, and doxycycline. She has competed the antibiotic. Notes her cough has completely improved.  Review of Systems  Constitutional: Negative for diaphoresis and fatigue.  Gastrointestinal: Negative for abdominal pain, nausea and vomiting.  Allergic/Immunologic: Positive for environmental allergies.    Patient Active Problem List   Diagnosis Date Noted  . Allergic rhinitis 03/26/2014  . Back pain 04/14/2013  . Iron deficiency anemia 07/10/2008  . ADENOMATOUS COLON POLYPS 05/28/2007  . ESOPHAGEAL STRICTURE 05/28/2007  . PEPTIC STRICTURE 05/17/2007  . DIVERTICULOSIS, COLON 05/17/2007  . Hypothyroidism 10/21/2006  . Hyperlipidemia 10/21/2006  . GERD 10/21/2006    Current Outpatient Prescriptions on File Prior to Visit  Medication Sig Dispense Refill  . benzonatate (TESSALON) 100 MG capsule Take 1-2 capsules (100-200 mg total) by mouth 3 (three) times daily as needed for cough. 40 capsule 0  . citalopram (CELEXA) 10 MG tablet Take 1 tablet (10 mg total) by mouth daily. 90 tablet 3  . HYDROcodone-homatropine (HYCODAN) 5-1.5 MG/5ML syrup Take 5 mLs by mouth every 8 (eight) hours as needed for cough. 120 mL 0  . levothyroxine (SYNTHROID) 50 MCG tablet Take 1 tablet (50 mcg total) by mouth daily before breakfast. 30 tablet 1  . omeprazole (PRILOSEC) 40  MG capsule Take 1 capsule (40 mg total) by mouth daily. 30 capsule 1   No current facility-administered medications on file prior to visit.     Allergies  Allergen Reactions  . Prochlorperazine Edisylate     "heads rolls back and eyes roll back"-compazine     Objective:  BP 120/80 (BP Location: Right Arm, Patient Position: Sitting, Cuff Size: Normal)   Pulse 75   Temp 98.2 F (36.8 C) (Oral)   Resp 17   Ht 5\' 1"  (1.549 m)   Wt 131 lb (59.4 kg)   SpO2 97%   BMI 24.75 kg/m   Physical Exam  Constitutional: She is oriented to person, place, and time and well-developed, well-nourished, and in no distress.  HENT:  Head: Normocephalic and atraumatic.  Right Ear: Tympanic membrane, external ear and ear canal normal.  Left Ear: External ear and ear canal normal. Tympanic membrane is not erythematous. A middle ear effusion is present.  Nose: Mucosal edema (bilaterally) present. Right sinus exhibits no maxillary sinus tenderness and no frontal sinus tenderness. Left sinus exhibits no maxillary sinus tenderness and no frontal sinus tenderness.  Mouth/Throat: Uvula is midline, oropharynx is clear and moist and mucous membranes are normal.  Eyes: Conjunctivae are normal.  Neck: Normal range of motion.  Cardiovascular: Normal rate, regular rhythm and normal heart sounds.   Pulmonary/Chest: Effort normal and breath sounds normal. She has no wheezes. She has no rales.  Neurological: She is alert and oriented to person, place, and time. Gait normal.  Skin: Skin is warm and dry.  Psychiatric: Affect normal.  Vitals reviewed.   Results for  orders placed or performed in visit on 05/10/16 (from the past 24 hour(s))  POCT CBC     Status: Abnormal   Collection Time: 05/10/16  9:49 AM  Result Value Ref Range   WBC 4.2 (A) 4.6 - 10.2 K/uL   Lymph, poc 1.4 0.6 - 3.4   POC LYMPH PERCENT 34.1 10 - 50 %L   MID (cbc) 0.4 0 - 0.9   POC MID % 9.4 0 - 12 %M   POC Granulocyte 2.4 2 - 6.9    Granulocyte percent 56.5 37 - 80 %G   RBC 3.88 (A) 4.04 - 5.48 M/uL   Hemoglobin 11.1 (A) 12.2 - 16.2 g/dL   HCT, POC 33.5 (A) 37.7 - 47.9 %   MCV 86.4 80 - 97 fL   MCH, POC 28.7 27 - 31.2 pg   MCHC 33.2 31.8 - 35.4 g/dL   RDW, POC 14.3 %   Platelet Count, POC 311 142 - 424 K/uL   MPV 7.2 0 - 99.8 fL    Assessment and Plan :   1. Disorder of Eustachian tube, unspecified laterality Hx and PE findings consistent with ETD. Will treat with oral antihistamine and sudafed. Pt cannot tolerate nasal steroids. Instructed to return to clinic if symptoms worsen, do not improve in 3-4 days, or as needed - pseudoephedrine (SUDAFED 12 HOUR) 120 MG 12 hr tablet; Take 1 tablet (120 mg total) by mouth 2 (two) times daily.  Dispense: 30 tablet; Refill: 0 2. Nasal congestion 3. Allergic rhinitis, unspecified seasonality, unspecified trigger Instructed to take zyrtec daily. 4. Community acquired pneumonia of right middle lobe of lung ([place]) Improved. Follow up in 2 weeks for repeat CXR to ensure resolution.  - POCT CBC  Tenna Delaine, PA-C  [facility] 05/11/2016 11:42 PM

## 2016-05-10 NOTE — Patient Instructions (Addendum)
For ear discomfort, please start taking zyrtec and sudafed daily. You can also use a cool mist humidifier by your bed at night time for relief. Please return to clinic if symptoms worsen, do not improve in 3-4 days, or as needed. Thank you for letting me participate in your health and well being.    Eustachian Tube Dysfunction The eustachian tube connects the middle ear to the back of the nose. It regulates air pressure in the middle ear by allowing air to move between the ear and nose. It also helps to drain fluid from the middle ear space. When the eustachian tube does not function properly, air pressure, fluid, or both can build up in the middle ear. Eustachian tube dysfunction can affect one or both ears. What are the causes? This condition happens when the eustachian tube becomes blocked or cannot open normally. This may result from:  Ear infections.  Colds and other upper respiratory infections.  Allergies.  Irritation, such as from cigarette smoke or acid from the stomach coming up into the esophagus (gastroesophageal reflux).  Sudden changes in air pressure, such as from descending in an airplane.  Abnormal growths in the nose or throat, such as nasal polyps, tumors, or enlarged tissue at the back of the throat (adenoids). What increases the risk? This condition may be more likely to develop in people who smoke and people who are overweight. Eustachian tube dysfunction may also be more likely to develop in children, especially children who have:  Certain birth defects of the mouth, such as cleft palate.  Large tonsils and adenoids. What are the signs or symptoms? Symptoms of this condition may include:  A feeling of fullness in the ear.  Ear pain.  Clicking or popping noises in the ear.  Ringing in the ear.  Hearing loss.  Loss of balance. Symptoms may get worse when the air pressure around you changes, such as when you travel to an area of high elevation or fly on an  airplane. How is this diagnosed? This condition may be diagnosed based on:  Your symptoms.  A physical exam of your ear, nose, and throat.  Tests, such as those that measure:  The movement of your eardrum (tympanogram).  Your hearing (audiometry). How is this treated? Treatment depends on the cause and severity of your condition. If your symptoms are mild, you may be able to relieve your symptoms by moving air into ("popping") your ears. If you have symptoms of fluid in your ears, treatment may include:  Decongestants.  Antihistamines.  Nasal sprays or ear drops that contain medicines that reduce swelling (steroids). In some cases, you may need to have a procedure to drain the fluid in your eardrum (myringotomy). In this procedure, a small tube is placed in the eardrum to:  Drain the fluid.  Restore the air in the middle ear space. Follow these instructions at home:  Take over-the-counter and prescription medicines only as told by your health care provider.  Use techniques to help pop your ears as recommended by your health care provider. These may include:  Chewing gum.  Yawning.  Frequent, forceful swallowing.  Closing your mouth, holding your nose closed, and gently blowing as if you are trying to blow air out of your nose.  Do not do any of the following until your health care provider approves:  Travel to high altitudes.  Fly in airplanes.  Work in a Pension scheme manager or room.  Scuba dive.  Keep your ears dry. Dry  your ears completely after showering or bathing.  Do not smoke.  Keep all follow-up visits as told by your health care provider. This is important. Contact a health care provider if:  Your symptoms do not go away after treatment.  Your symptoms come back after treatment.  You are unable to pop your ears.  You have:  A fever.  Pain in your ear.  Pain in your head or neck.  Fluid draining from your ear.  Your hearing suddenly  changes.  You become very dizzy.  You lose your balance. This information is not intended to replace advice given to you by your health care provider. Make sure you discuss any questions you have with your health care provider. Document Released: 01/30/2015 Document Revised: 06/11/2015 Document Reviewed: 01/22/2014 Elsevier Interactive Patient Education  2017 [employer].    IF you received an x-ray today, you will receive an invoice from [facility]. Please contact [facility] at [phone] with questions or concerns regarding your invoice.   IF you received labwork today, you will receive an invoice from [place]. Please contact [facility] at [phone] with questions or concerns regarding your invoice.   Our billing staff will not be able to assist you with questions regarding bills from these companies.  You will be contacted with the lab results as soon as they are available. The fastest way to get your results is to activate your My Chart account. Instructions are located on the last page of this paperwork. If you have not heard from [place] regarding the results in 2 weeks, please contact this office.

## 2016-05-11 ENCOUNTER — Encounter: Payer: Self-pay | Admitting: Physician Assistant

## 2016-05-17 ENCOUNTER — Telehealth: Payer: Self-pay | Admitting: *Deleted

## 2016-05-17 ENCOUNTER — Ambulatory Visit (AMBULATORY_SURGERY_CENTER): Payer: Self-pay | Admitting: *Deleted

## 2016-05-17 VITALS — Ht 61.0 in | Wt 130.6 lb

## 2016-05-17 DIAGNOSIS — Z86010 Personal history of colonic polyps: Secondary | ICD-10-CM

## 2016-05-17 MED ORDER — NA SULFATE-K SULFATE-MG SULF 17.5-3.13-1.6 GM/177ML PO SOLN: 1.0000 | Freq: Once | ORAL | 0 refills | Status: AC

## 2016-05-17 NOTE — Progress Notes (Signed)
No egg or soy allergy known to patient  PONV issues with past sedation with any surgeries  or procedures, no intubation problems  No diet pills per patient No home 02 use per patient  No blood thinners per patient  Pt states occ  issues with constipation - not chronic issues  No A fib or A flutter  EMMI video sent to pt's e mail  Pt has hx of stricture- she states she occ has to pat herself to swallow- not severe issues but asked about egd with colon- explained I can send TE to Dr Henrene Pastor and ask about adding but explained may change her date- pt wishes to just do colon- on the 15th but did send TE to Dr Henrene Pastor about her egd

## 2016-05-17 NOTE — Telephone Encounter (Signed)
Dr Henrene Pastor,  I saw Holly Kaufman this morning in [place]. She has a history of a stricture and had a dilation in the past .  She states she isn't having major swallowing issues, however she has had several issues with swallowing. She is scheduled for 05-31-16 at 9 am for a recall colon. She wondered if she needed to add an EGD.  She does not want to loose the 15th and you are booked for that morning so I was not sure what you wanted me to do.  Do you want to over book to add an EGD or discuss with her at her colon.  Please advise, thanks for your time,   Marijean Niemann

## 2016-05-17 NOTE — Telephone Encounter (Signed)
She can discuss it with me on the day of her procedure and we can decide thereafter if she needs an office evaluation or an EGD

## 2016-05-17 NOTE — Telephone Encounter (Signed)
Informed patient per DR Henrene Pastor will discuss day of colon- pt verbalized understanding- Lelan Pons PV

## 2016-05-31 ENCOUNTER — Encounter: Payer: Self-pay | Admitting: Internal Medicine

## 2016-05-31 ENCOUNTER — Ambulatory Visit (AMBULATORY_SURGERY_CENTER): Payer: 59 | Admitting: Internal Medicine

## 2016-05-31 VITALS — BP 95/61 | HR 62 | Temp 97.5°F | Resp 13 | Ht 61.0 in | Wt 130.0 lb

## 2016-05-31 DIAGNOSIS — K635 Polyp of colon: Secondary | ICD-10-CM | POA: Diagnosis not present

## 2016-05-31 DIAGNOSIS — D125 Benign neoplasm of sigmoid colon: Secondary | ICD-10-CM

## 2016-05-31 DIAGNOSIS — Z86010 Personal history of colonic polyps: Secondary | ICD-10-CM | POA: Diagnosis not present

## 2016-05-31 MED ORDER — SODIUM CHLORIDE 0.9 % IV SOLN: 500.0000 mL | INTRAVENOUS | Status: DC

## 2016-05-31 NOTE — Progress Notes (Signed)
To recovery, report to Hodges, RN, VSS 

## 2016-05-31 NOTE — Progress Notes (Signed)
Pt's states no medical or surgical changes since previsit or office visit. 

## 2016-05-31 NOTE — Progress Notes (Signed)
Please refer to the blue and neon green sheets for instructions regarding diet and activity for the rest of today.Called to room to assist during endoscopic procedure.  Patient ID and intended procedure confirmed with present staff. Received instructions for my participation in the procedure from the performing physician. 

## 2016-05-31 NOTE — Patient Instructions (Addendum)
YOU HAD AN ENDOSCOPIC PROCEDURE TODAY AT [facility]:   Refer to the procedure report that was given to you for any specific questions about what was found during the examination.  If the procedure report does not answer your questions, please call your gastroenterologist to clarify.  If you requested that your care partner not be given the details of your procedure findings, then the procedure report has been included in a sealed envelope for you to review at your convenience later.  YOU SHOULD EXPECT: Some feelings of bloating in the abdomen. Passage of more gas than usual.  Walking can help get rid of the air that was put into your GI tract during the procedure and reduce the bloating. If you had a lower endoscopy (such as a colonoscopy or flexible sigmoidoscopy) you may notice spotting of blood in your stool or on the toilet paper. If you underwent a bowel prep for your procedure, you may not have a normal bowel movement for a few days.  Please Note:  You might notice some irritation and congestion in your nose or some drainage.  This is from the oxygen used during your procedure.  There is no need for concern and it should clear up in a day or so.  SYMPTOMS TO REPORT IMMEDIATELY:   Following lower endoscopy (colonoscopy or flexible sigmoidoscopy):  Excessive amounts of blood in the stool  Significant tenderness or worsening of abdominal pains  Swelling of the abdomen that is new, acute  Fever of 100F or higher   For urgent or emergent issues, a gastroenterologist can be reached at any hour by calling [phone].   DIET:  We do recommend a small meal at first, but then you may proceed to your regular diet.  Drink plenty of fluids but you should avoid alcoholic beverages for 24 hours. Try to increase the fiber in your diet, and drink plenty of water.  ACTIVITY:  You should plan to take it easy for the rest of today and you should NOT DRIVE or use heavy machinery until  tomorrow (because of the sedation medicines used during the test).    FOLLOW UP: Our staff will call the number listed on your records the next business day following your procedure to check on you and address any questions or concerns that you may have regarding the information given to you following your procedure. If we do not reach you, we will leave a message.  However, if you are feeling well and you are not experiencing any problems, there is no need to return our call.  We will assume that you have returned to your regular daily activities without incident.  If any biopsies were taken you will be contacted by phone or by letter within the next 1-3 weeks.  Please call us at [phone] if you have not heard about the biopsies in 3 weeks.    SIGNATURES/CONFIDENTIALITY: You and/or your care partner have signed paperwork which will be entered into your electronic medical record.  These signatures attest to the fact that that the information above on your After Visit Summary has been reviewed and is understood.  Full responsibility of the confidentiality of this discharge information lies with you and/or your care-partner.  Read all of the handouts given to you by your recovery room nurse.    Your pre-visit is on 07/11/2016 a 0830. Check in in 3rd floor, and come up to the 4th floor for your pre-visit.  Your Endoscopy is  08/02/2016 t 1000am.  Be here on the 4th floor t 0900.

## 2016-05-31 NOTE — Op Note (Signed)
[facility] Patient Name: Holly Kaufman Procedure Date: 05/31/2016 9:14 AM MRN: [id number] Endoscopist: Docia Chuck. Henrene Pastor , MD Age: 61 Referring MD:  Date of Birth: 1956/01/02 Gender: Female Account #: [id number] Procedure:                Colonoscopy, with cold snare polypectomy x 1 Indications:              High risk colon cancer surveillance: Personal                            history of multiple (3 or more) adenomas. Prior                            examinations 2003, 2008, and 2013 Medicines:                Monitored Anesthesia Care Procedure:                Pre-Anesthesia Assessment:                           - Prior to the procedure, a History and Physical                            was performed, and patient medications and                            allergies were reviewed. The patient's tolerance of                            previous anesthesia was also reviewed. The risks                            and benefits of the procedure and the sedation                            options and risks were discussed with the patient.                            All questions were answered, and informed consent                            was obtained. Prior Anticoagulants: The patient has                            taken no previous anticoagulant or antiplatelet                            agents. ASA Grade Assessment: II - A patient with                            mild systemic disease. After reviewing the risks                            and benefits, the patient was deemed in  satisfactory condition to undergo the procedure.                           After obtaining informed consent, the colonoscope                            was passed under direct vision. Throughout the                            procedure, the patient's blood pressure, pulse, and                            oxygen saturations were monitored continuously. The   Colonoscope was introduced through the anus and                            advanced to the the cecum, identified by                            appendiceal orifice and ileocecal valve. The                            ileocecal valve, appendiceal orifice, and rectum                            were photographed. The quality of the bowel                            preparation was excellent. The colonoscopy was                            performed without difficulty. The patient tolerated                            the procedure well. The bowel preparation used was                            SUPREP. Scope In: 9:20:20 AM Scope Out: 9:32:22 AM Scope Withdrawal Time: 0 hours 9 minutes 25 seconds  Total Procedure Duration: 0 hours 12 minutes 2 seconds  Findings:                 A 3 mm polyp was found in the sigmoid colon. The                            polyp was removed with a cold snare. Resection and                            retrieval were complete.                           A few small-mouthed diverticula were found in the                            sigmoid colon.  Internal hemorrhoids were found during retroflexion.                           The exam was otherwise without abnormality on                            direct and retroflexion views. Complications:            No immediate complications. Estimated blood loss:                            None. Estimated Blood Loss:     Estimated blood loss: none. Impression:               - One 3 mm polyp in the sigmoid colon, removed with                            a cold snare. Resected and retrieved.                           - Diverticulosis in the sigmoid colon.                           - Internal hemorrhoids.                           - The examination was otherwise normal on direct                            and retroflexion views. Recommendation:           - Repeat colonoscopy in 5 years for surveillance.                            - Patient has a contact number available for                            emergencies. The signs and symptoms of potential                            delayed complications were discussed with the                            patient. Return to normal activities tomorrow.                            Written discharge instructions were provided to the                            patient.                           - Resume previous diet.                           - Continue present medications.                           -  Await pathology results.                           ***PLEASE SCHEDULE THE PATIENT for EGD with                            esophageal dilation in the [facility]. "Reflux symptoms                            despite PPI and recurrent dysphagia" Taishawn Smaldone N. Henrene Pastor, MD 05/31/2016 9:38:09 AM This report has been signed electronically.

## 2016-06-01 ENCOUNTER — Telehealth: Payer: Self-pay | Admitting: *Deleted

## 2016-06-01 NOTE — Telephone Encounter (Signed)
  Follow up Call-  Call back number 05/31/2016  Post procedure Call Back phone  # [phone]  Permission to leave phone message Yes  Some recent data might be hidden     Patient questions:  Do you have a fever, pain , or abdominal swelling? No. Pain Score  0 *  Have you tolerated food without any problems? Yes.    Have you been able to return to your normal activities? No.  Do you have any questions about your discharge instructions: Diet   No. Medications  No. Follow up visit  No.  Do you have questions or concerns about your Care? No.  Actions: * If pain score is 4 or above: No action needed, pain <4.

## 2016-06-06 ENCOUNTER — Encounter: Payer: Self-pay | Admitting: Internal Medicine

## 2016-06-10 ENCOUNTER — Ambulatory Visit: Payer: Commercial Managed Care - HMO | Admitting: Family Medicine

## 2016-06-17 ENCOUNTER — Other Ambulatory Visit: Payer: Self-pay | Admitting: Family Medicine

## 2016-06-21 ENCOUNTER — Ambulatory Visit: Payer: Commercial Managed Care - HMO | Admitting: Family Medicine

## 2016-07-11 ENCOUNTER — Ambulatory Visit (AMBULATORY_SURGERY_CENTER): Payer: Self-pay

## 2016-07-11 VITALS — Ht 61.0 in | Wt 127.0 lb

## 2016-07-11 DIAGNOSIS — R1310 Dysphagia, unspecified: Secondary | ICD-10-CM

## 2016-07-11 NOTE — Progress Notes (Signed)
Denies allergies to eggs or soy products. Denies complication of anesthesia or sedation. Denies use of weight loss medication. Denies use of O2.   Emmi instructions given for colonoscopy.  

## 2016-07-25 ENCOUNTER — Encounter: Payer: Self-pay | Admitting: Internal Medicine

## 2016-07-25 ENCOUNTER — Encounter: Payer: 59 | Admitting: Internal Medicine

## 2016-08-02 ENCOUNTER — Ambulatory Visit (AMBULATORY_SURGERY_CENTER): Payer: 59 | Admitting: Internal Medicine

## 2016-08-02 ENCOUNTER — Encounter: Payer: Self-pay | Admitting: Internal Medicine

## 2016-08-02 VITALS — BP 99/66 | HR 64 | Temp 98.0°F | Resp 14 | Ht 61.0 in | Wt 127.0 lb

## 2016-08-02 DIAGNOSIS — K2100 Gastro-esophageal reflux disease with esophagitis, without bleeding: Secondary | ICD-10-CM

## 2016-08-02 DIAGNOSIS — K222 Esophageal obstruction: Secondary | ICD-10-CM | POA: Diagnosis not present

## 2016-08-02 DIAGNOSIS — R1310 Dysphagia, unspecified: Secondary | ICD-10-CM | POA: Diagnosis not present

## 2016-08-02 MED ORDER — OMEPRAZOLE 40 MG PO CPDR: 40.0000 mg | DELAYED_RELEASE_CAPSULE | Freq: Two times a day (BID) | ORAL | 11 refills | Status: DC

## 2016-08-02 MED ORDER — SODIUM CHLORIDE 0.9 % IV SOLN: 500.0000 mL | INTRAVENOUS | Status: AC

## 2016-08-02 NOTE — Progress Notes (Signed)
Spontaneous respirations throughout. VSS. Resting comfortably. To PACU on room air. Report to  Jane RN. 

## 2016-08-02 NOTE — Progress Notes (Signed)
Pt. Requested that omeprazole 40mg . Be prescribed for 3 months at a time.  Dr. Henrene Pastor advised and ordered omeprazole 40mg . Twice Daily #180 with 3 refills.   [facility] [place] called with change.

## 2016-08-02 NOTE — Patient Instructions (Signed)
YOU HAD AN ENDOSCOPIC PROCEDURE TODAY AT [facility]:   Refer to the procedure report that was given to you for any specific questions about what was found during the examination.  If the procedure report does not answer your questions, please call your gastroenterologist to clarify.  If you requested that your care partner not be given the details of your procedure findings, then the procedure report has been included in a sealed envelope for you to review at your convenience later.  YOU SHOULD EXPECT: Some feelings of bloating in the abdomen. Passage of more gas than usual.  Walking can help get rid of the air that was put into your GI tract during the procedure and reduce the bloating. If you had a lower endoscopy (such as a colonoscopy or flexible sigmoidoscopy) you may notice spotting of blood in your stool or on the toilet paper. If you underwent a bowel prep for your procedure, you may not have a normal bowel movement for a few days.  Please Note:  You might notice some irritation and congestion in your nose or some drainage.  This is from the oxygen used during your procedure.  There is no need for concern and it should clear up in a day or so.  SYMPTOMS TO REPORT IMMEDIATELY:    Following upper endoscopy (EGD)  Vomiting of blood or coffee ground material  New chest pain or pain under the shoulder blades  Painful or persistently difficult swallowing  New shortness of breath  Fever of 100F or higher  Black, tarry-looking stools  For urgent or emergent issues, a gastroenterologist can be reached at any hour by calling [phone].   DIET:  We do recommend a small meal at first, but then you may proceed to your regular diet.  Drink plenty of fluids but you should avoid alcoholic beverages for 24 hours.  ACTIVITY:  You should plan to take it easy for the rest of today and you should NOT DRIVE or use heavy machinery until tomorrow (because of the sedation medicines used  during the test).    FOLLOW UP: Our staff will call the number listed on your records the next business day following your procedure to check on you and address any questions or concerns that you may have regarding the information given to you following your procedure. If we do not reach you, we will leave a message.  However, if you are feeling well and you are not experiencing any problems, there is no need to return our call.  We will assume that you have returned to your regular daily activities without incident.  If any biopsies were taken you will be contacted by phone or by letter within the next 1-3 weeks.  Please call us at [phone] if you have not heard about the biopsies in 3 weeks.    SIGNATURES/CONFIDENTIALITY: You and/or your care partner have signed paperwork which will be entered into your electronic medical record.  These signatures attest to the fact that that the information above  Reflux precautions, after dilation diet, given.    See Dr. Henrene Pastor in office- 3 months.  Increase omeprazole to 40mg  twice daily.

## 2016-08-02 NOTE — Op Note (Addendum)
Holly Kaufman Procedure Date: 08/02/2016 10:19 AM MRN: [id number] Endoscopist: Docia Chuck. Holly Kaufman , MD Age: 61 Referring MD:  Date of Birth: 17-Jul-1955 Gender: Female Account #: [id number] Procedure:                Upper GI endoscopy, with Venia Minks dilation of the                            esophagus-51F Indications:              Dysphagia, Esophageal reflux Medicines:                Monitored Anesthesia Care Procedure:                Pre-Anesthesia Assessment:                           - Prior to the procedure, a History and Physical                            was performed, and patient medications and                            allergies were reviewed. The patient's tolerance of                            previous anesthesia was also reviewed. The risks                            and benefits of the procedure and the sedation                            options and risks were discussed with the patient.                            All questions were answered, and informed consent                            was obtained. Prior Anticoagulants: The patient has                            taken no previous anticoagulant or antiplatelet                            agents. ASA Grade Assessment: II - A patient with                            mild systemic disease. After reviewing the risks                            and benefits, the patient was deemed in                            satisfactory condition to undergo the procedure.  After obtaining informed consent, the endoscope was                            passed under direct vision. Throughout the                            procedure, the patient's blood pressure, pulse, and                            oxygen saturations were monitored continuously. The                            Model GIF-HQ190 [id number]) scope was introduced                            through the mouth, and advanced  to the second part                            of duodenum. The upper GI endoscopy was                            accomplished without difficulty. The patient                            tolerated the procedure well. Scope In: Scope Out: Findings:                 One mild benign-appearing, intrinsic stenosis was                            found 30 cm from the incisors. This measured 1.5 cm                            (inner diameter). There was active esophagitis as                            manifested by inflammation, edema, friability, and                            a focal ulcer. The scope was withdrawn. Dilation                            was performed with a Maloney dilator with no                            resistance at 37 Fr. No heme. Tolerated well.                           The exam of the esophagus was otherwise normal.                           The stomach revealed a 5 cm sliding hiatal hernia  with [name] erosions. As well multiple small                            benign-appearing fundic gland type polyps. Stomach                            was otherwise normal.                           The examined duodenum was normal.                           The cardia and gastric fundus were normal on                            retroflexion, save previously mentioned findings. Complications:            No immediate complications. Estimated Blood Loss:     Estimated blood loss: none. Impression:               1. GERD with erosive esophagitis and peptic                            stricture status post dilation                           2. Moderate hiatal hernia with [name] erosions                           3. Incidental fundic gland polyps. Otherwise normal                            exam. Recommendation:           #1. Increase omeprazole to 40 mg twice daily; #60;                            11 refills                           #2. Reflux precautions                            #3. Post-dilation diet                           #4. Office follow-up with Dr. Henrene Kaufman in about 3                            months. Docia Chuck. Holly Pastor, MD 08/02/2016 10:35:41 AM This report has been signed electronically.

## 2016-08-02 NOTE — Progress Notes (Signed)
Called to room to assist during endoscopic procedure.  Patient ID and intended procedure confirmed with present staff. Received instructions for my participation in the procedure from the performing physician.  

## 2016-08-03 ENCOUNTER — Telehealth: Payer: Self-pay | Admitting: *Deleted

## 2016-08-03 NOTE — Telephone Encounter (Signed)
  Follow up Call-  Call back number 08/02/2016 05/31/2016  Post procedure Call Back phone  # [phone]  Permission to leave phone message Yes Yes  Some recent data might be hidden     Patient questions:  Unable to call out.  Will try again later.

## 2016-08-03 NOTE — Telephone Encounter (Signed)
  Follow up Call-  Call back number 08/02/2016 05/31/2016  Post procedure Call Back phone  # [phone]  Permission to leave phone message Yes Yes  Some recent data might be hidden    [facility]

## 2016-09-16 ENCOUNTER — Encounter: Payer: Self-pay | Admitting: Internal Medicine

## 2016-10-04 ENCOUNTER — Encounter: Payer: Self-pay | Admitting: Family Medicine

## 2016-10-04 NOTE — Telephone Encounter (Signed)
error:[id number] ° °

## 2016-11-23 ENCOUNTER — Encounter: Payer: Self-pay | Admitting: Internal Medicine

## 2016-11-23 ENCOUNTER — Ambulatory Visit: Payer: 59 | Admitting: Internal Medicine

## 2016-11-23 VITALS — BP 104/68 | HR 68 | Ht 61.0 in | Wt 129.0 lb

## 2016-11-23 DIAGNOSIS — K222 Esophageal obstruction: Secondary | ICD-10-CM

## 2016-11-23 DIAGNOSIS — K2100 Gastro-esophageal reflux disease with esophagitis, without bleeding: Secondary | ICD-10-CM

## 2016-11-23 DIAGNOSIS — K219 Gastro-esophageal reflux disease without esophagitis: Secondary | ICD-10-CM | POA: Diagnosis not present

## 2016-11-23 MED ORDER — OMEPRAZOLE 40 MG PO CPDR: 40.0000 mg | DELAYED_RELEASE_CAPSULE | Freq: Two times a day (BID) | ORAL | 11 refills | Status: DC

## 2016-11-23 NOTE — Patient Instructions (Signed)
We have sent the following medications to your pharmacy for you to pick up at your convenience:  Prilosec  Please follow up in one year  

## 2016-11-23 NOTE — Progress Notes (Signed)
HISTORY OF PRESENT ILLNESS:  Holly Kaufman is a 61 y.o. female with hypothyroidism, hyperlipidemia, adenomatous colon polyps, and chronic GERD complicated by erosive esophagitis and peptic stricture requiring esophageal dilation. The patient presents today for follow-up regarding ongoing management of her complicated GERD and dysphagia. The patient underwent surveillance colonoscopy 05/31/2016. She was found to have a diminutive colon polyp, diverticulosis, and internal hemorrhoids. Follow-up in 5 years recommended. At that time she complained of active reflux symptoms despite PPI as well as recurrent dysphagia. Thus, upper endoscopy was scheduled and performed 08/02/2016. The patient was found to have active esophagitis with ulceration, stricture formation, and hiatal hernia with Holly Kaufman erosions. The esophagus was dilated to 27 [place]. Her omeprazole was increased to 40 mg twice daily. Reflux precautions instituted. She presents for follow-up at this time as requested. The patient is delighted to report that her reflux symptoms have resolved. She is also pleased that she has had no further dysphagia. No appreciable medication side effects. No new GI problems or issues.  REVIEW OF SYSTEMS:  All non-GI ROS negative except for sinus and allergy, arthritis, back pain, occasional headache, itching from dry skin, night sweats, sleeping problems  Past Medical History:  Diagnosis Date  . Acute sinusitis 01/22/2015  . Allergy   . Anemia   . Anxiety    celexa occasionally  . Arthritis    hands  . Back pain 04/14/2013  . Cervical cancer screening 07/10/2008   Qualifier: Diagnosis of  By: Holly Drilling MD, Holly Kaufman   . Chicken pox as a child  . Colon polyps    adenomatous  . Depression with anxiety 02/01/2015  . Diverticulosis   . Esophageal stricture   . GERD (gastroesophageal reflux disease)   . [place] measles as a child  . Hiatal hernia   . Hyperlipidemia    Patient denies that she has hyperlipidemia.   . Internal hemorrhoids   . Measles as a child  . Mumps as a child  . PONV (postoperative nausea and vomiting)   . Shingles 61 yrs old  . Thyroid disease    hypothyroid  . Ulcer     Past Surgical History:  Procedure Laterality Date  . COLONOSCOPY    . POLYPECTOMY    . UPPER GASTROINTESTINAL ENDOSCOPY    . VAGINAL HYSTERECTOMY  61 yrs old   partial  . WISDOM TOOTH EXTRACTION  61 yrs old    Social History Holly Kaufman  reports that  has never smoked. she has never used smokeless tobacco. She reports that she does not drink alcohol or use drugs.  family history includes CVA in her mother; Diabetes in her maternal grandfather and paternal grandmother; Heart disease in her maternal grandfather and mother; Hyperlipidemia in her mother and sister; Other in her father; Stomach cancer in her paternal uncle; Stroke in her maternal grandmother and paternal grandfather.  Allergies  Allergen Reactions  . Prochlorperazine Edisylate     "heads rolls back and eyes roll back"-compazine       PHYSICAL EXAMINATION: Vital signs: BP 104/68   Pulse 68   Ht 5\' 1"  (1.549 m)   Wt 129 lb (58.5 kg)   BMI 24.37 kg/m  General: Well-developed, well-nourished, no acute distress HEENT: Sclerae are anicteric, conjunctiva pink. Oral mucosa intact Lungs: Clear Heart: Regular Abdomen: soft, nontender, nondistended, no obvious ascites, no peritoneal signs, normal bowel sounds. No organomegaly. Extremities: No clubbing cyanosis or edema Psychiatric: alert and oriented x3. Cooperative    ASSESSMENT:  #1.  GERD, complicated by erosive esophagitis and symptomatic peptic stricture. Problems have improved post dilation on increased dose of PPI #2. History of adenomatous colon polyps. Surveillance up-to-date   PLAN:  #1. Reflux precautions #2. Continue omeprazole 40 mg twice daily. Prescription refill for one year #3. Routine office follow-up one year. Sooner if needed for questions or  problems #4. Surveillance colonoscopy in 5 years  15 minutes spent face-to-face with the patient. Greater than 50% a time use for counseling regarding her complicated GERD, its management, and follow-up plans

## 2017-11-09 ENCOUNTER — Other Ambulatory Visit (HOSPITAL_BASED_OUTPATIENT_CLINIC_OR_DEPARTMENT_OTHER): Payer: Self-pay | Admitting: Family Medicine

## 2017-11-09 DIAGNOSIS — Z1231 Encounter for screening mammogram for malignant neoplasm of breast: Secondary | ICD-10-CM

## 2017-11-11 ENCOUNTER — Ambulatory Visit (HOSPITAL_BASED_OUTPATIENT_CLINIC_OR_DEPARTMENT_OTHER)
Admission: RE | Admit: 2017-11-11 | Discharge: 2017-11-11 | Disposition: A | Discharge status: Home / Self Care | Payer: 59 | Source: Ambulatory Visit | Attending: Family Medicine | Admitting: Family Medicine

## 2017-11-11 DIAGNOSIS — Z1231 Encounter for screening mammogram for malignant neoplasm of breast: Secondary | ICD-10-CM | POA: Insufficient documentation

## 2019-02-15 IMAGING — DX DG CHEST 2V
2 series · 2 of 2 positions shown · non-contrast
Comparison: None.

CLINICAL DATA: Productive cough over the last 5 days.  Fever.

EXAM:
CHEST  2 VIEW

[chest pa]
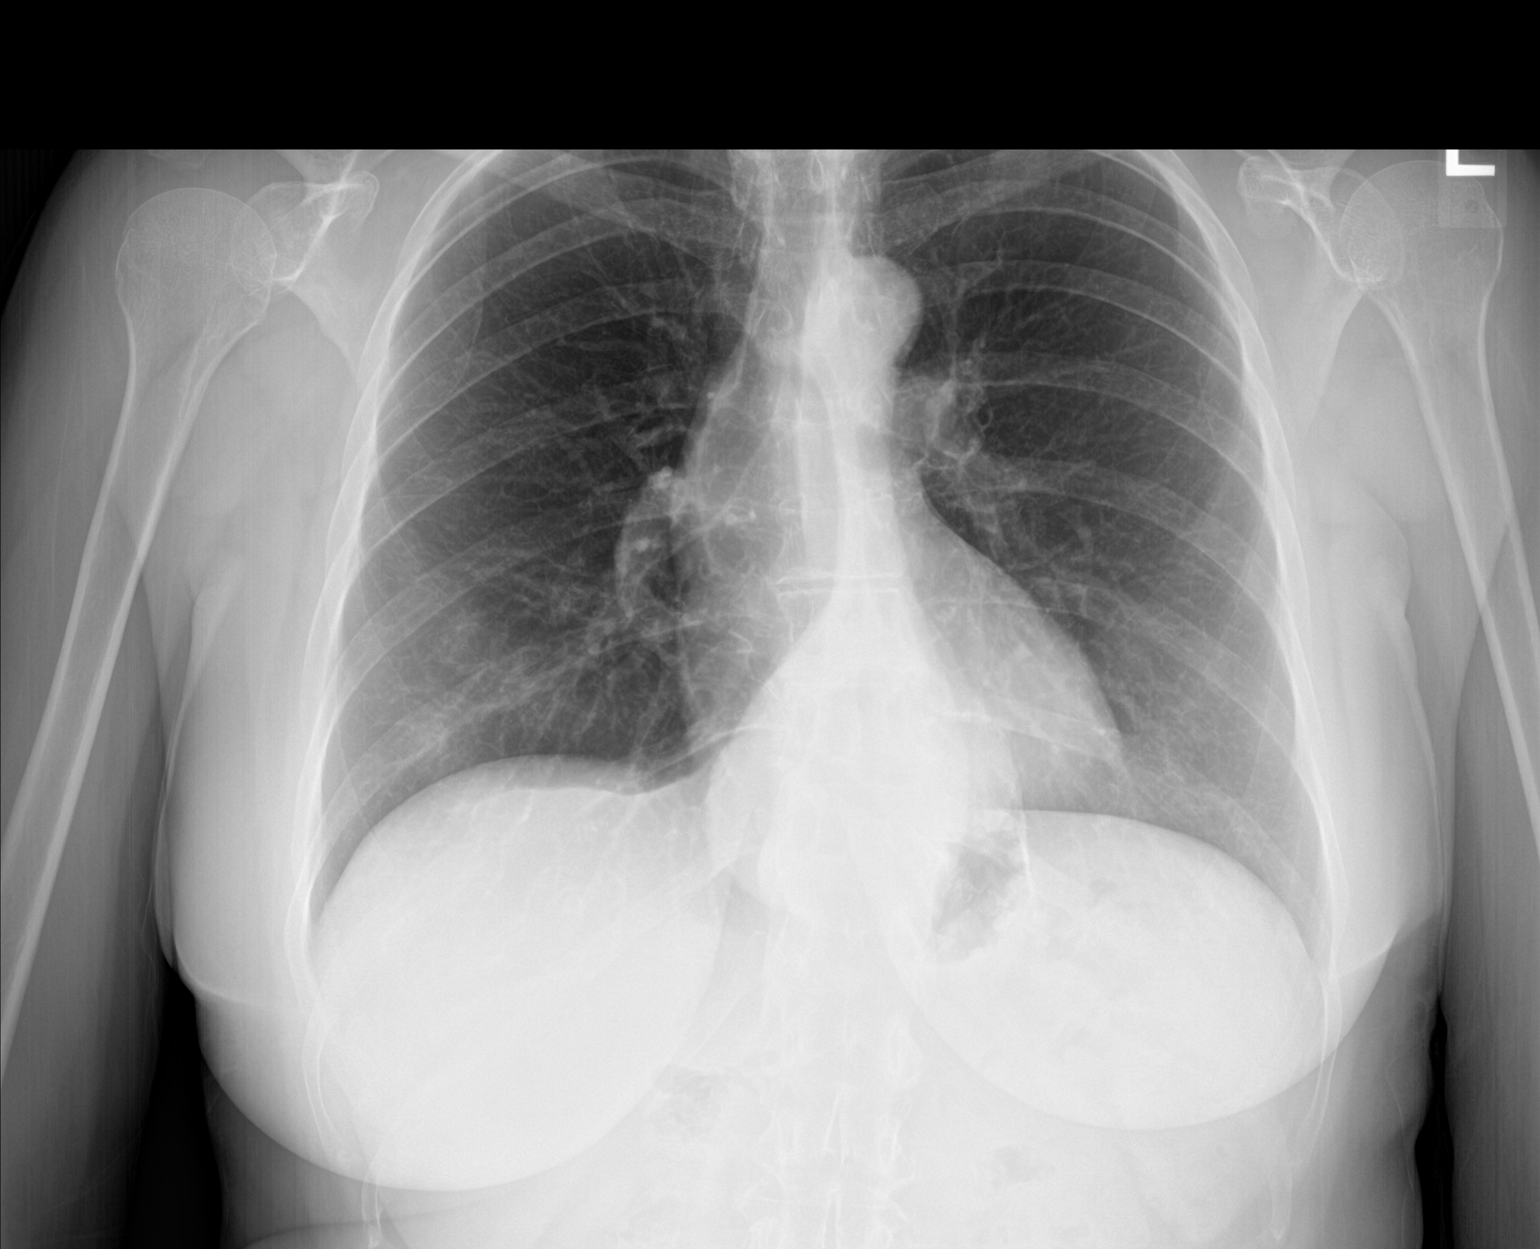

[chest lat]
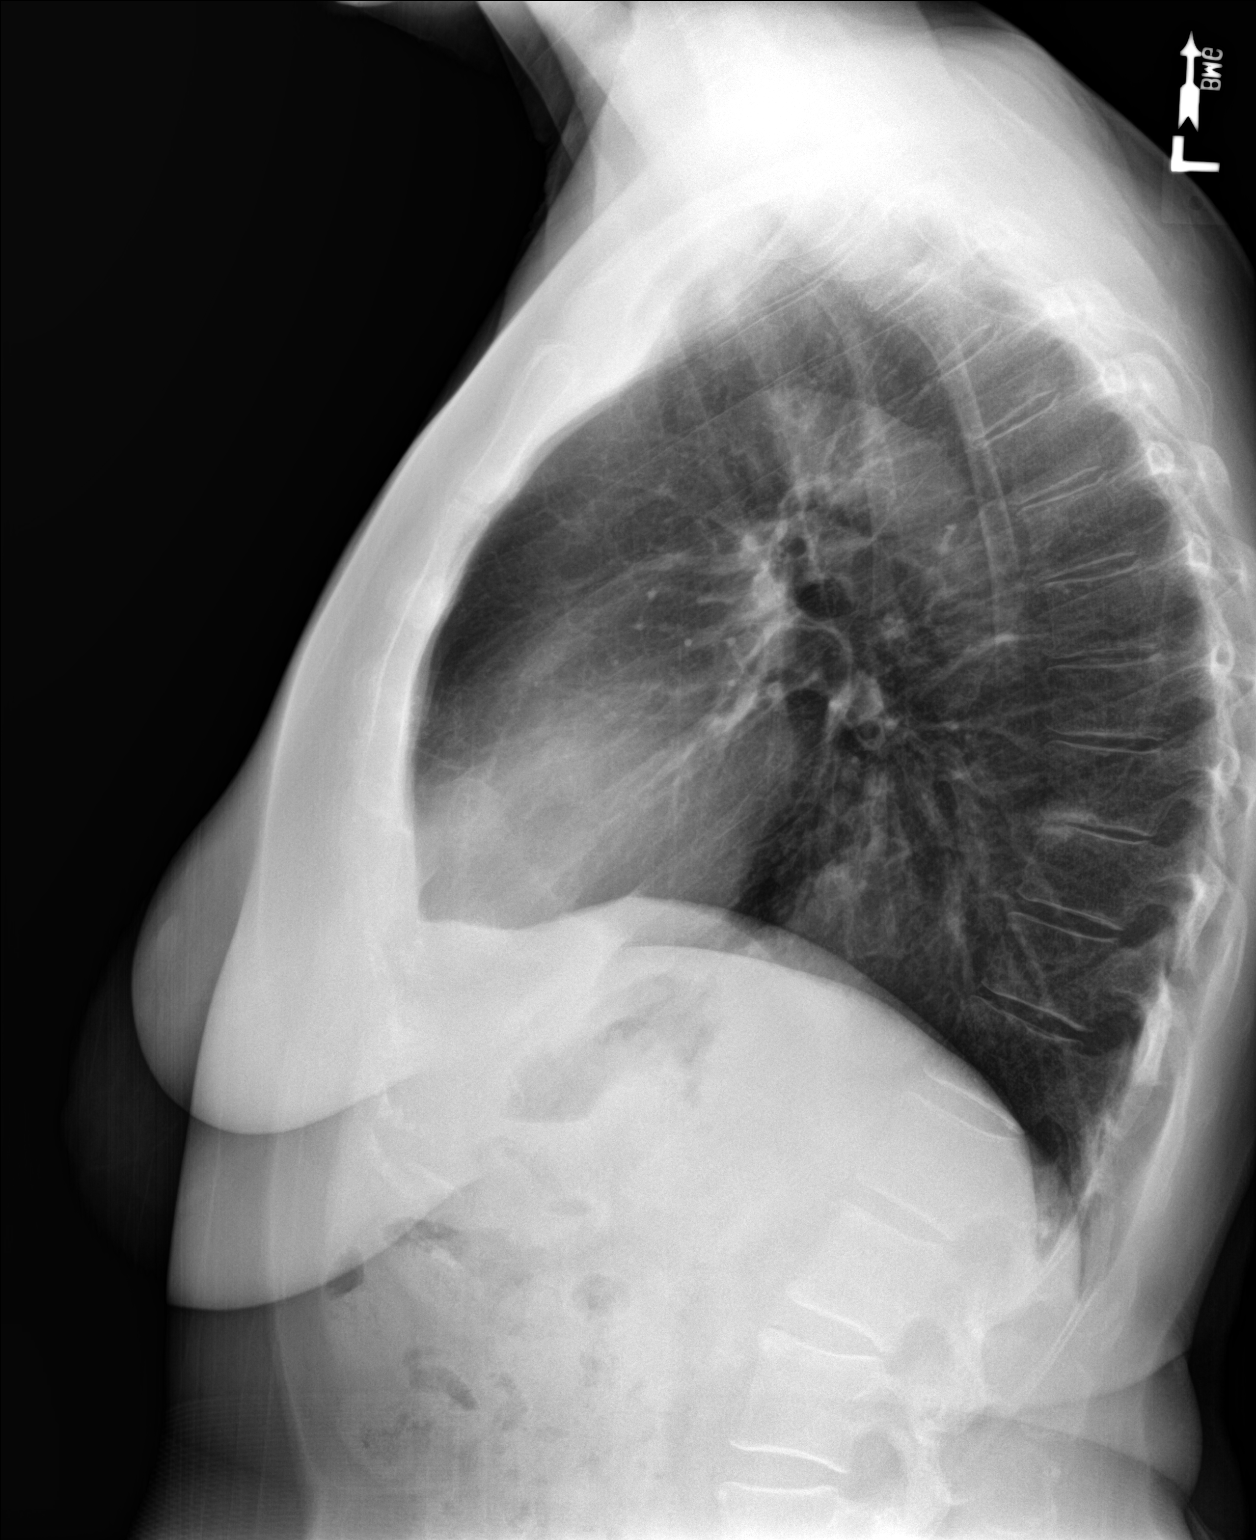

[2 of 2 positions shown; findings below may reference images not displayed]

FINDINGS: Heart size is normal. Fullness of the inferior mediastinum probably
secondary to a hiatal hernia. Patchy infiltrate in the right middle
lobe consistent with patchy bronchopneumonia. No dense
consolidation, collapse or effusion. No acute bone finding.
IMPRESSION: Patchy right middle lobe pneumonia.

Inferior mediastinal density probably secondary to hiatal hernia.

## 2019-05-28 ENCOUNTER — Other Ambulatory Visit (HOSPITAL_BASED_OUTPATIENT_CLINIC_OR_DEPARTMENT_OTHER): Payer: Self-pay | Admitting: Family Medicine

## 2019-05-28 DIAGNOSIS — Z1231 Encounter for screening mammogram for malignant neoplasm of breast: Secondary | ICD-10-CM

## 2019-05-29 ENCOUNTER — Ambulatory Visit (HOSPITAL_BASED_OUTPATIENT_CLINIC_OR_DEPARTMENT_OTHER)
Admission: RE | Admit: 2019-05-29 | Discharge: 2019-05-29 | Disposition: A | Discharge status: Home / Self Care | Payer: BC Managed Care – PPO | Source: Ambulatory Visit | Attending: Family Medicine | Admitting: Family Medicine

## 2019-05-29 ENCOUNTER — Other Ambulatory Visit: Payer: Self-pay

## 2019-05-29 DIAGNOSIS — Z1231 Encounter for screening mammogram for malignant neoplasm of breast: Secondary | ICD-10-CM | POA: Diagnosis not present

## 2019-05-30 ENCOUNTER — Other Ambulatory Visit: Payer: Self-pay | Admitting: Family Medicine

## 2019-05-30 DIAGNOSIS — R928 Other abnormal and inconclusive findings on diagnostic imaging of breast: Secondary | ICD-10-CM

## 2019-05-31 ENCOUNTER — Ambulatory Visit
Admission: RE | Admit: 2019-05-31 | Discharge: 2019-05-31 | Disposition: A | Discharge status: Home / Self Care | Payer: BC Managed Care – PPO | Source: Ambulatory Visit | Attending: Family Medicine | Admitting: Family Medicine

## 2019-05-31 ENCOUNTER — Other Ambulatory Visit: Payer: Self-pay

## 2019-05-31 ENCOUNTER — Other Ambulatory Visit: Payer: Self-pay | Admitting: Family Medicine

## 2019-05-31 DIAGNOSIS — R928 Other abnormal and inconclusive findings on diagnostic imaging of breast: Secondary | ICD-10-CM

## 2019-05-31 DIAGNOSIS — N6320 Unspecified lump in the left breast, unspecified quadrant: Secondary | ICD-10-CM

## 2019-06-24 ENCOUNTER — Encounter: Payer: Self-pay | Admitting: Internal Medicine

## 2019-06-24 ENCOUNTER — Ambulatory Visit (INDEPENDENT_AMBULATORY_CARE_PROVIDER_SITE_OTHER): Payer: BC Managed Care – PPO | Admitting: Internal Medicine

## 2019-06-24 VITALS — BP 104/72 | HR 78 | Ht 61.0 in | Wt 132.0 lb

## 2019-06-24 DIAGNOSIS — R1310 Dysphagia, unspecified: Secondary | ICD-10-CM | POA: Diagnosis not present

## 2019-06-24 DIAGNOSIS — K222 Esophageal obstruction: Secondary | ICD-10-CM | POA: Diagnosis not present

## 2019-06-24 DIAGNOSIS — K2100 Gastro-esophageal reflux disease with esophagitis, without bleeding: Secondary | ICD-10-CM

## 2019-06-24 DIAGNOSIS — Z86010 Personal history of colonic polyps: Secondary | ICD-10-CM

## 2019-06-24 DIAGNOSIS — R1319 Other dysphagia: Secondary | ICD-10-CM

## 2019-06-24 MED ORDER — OMEPRAZOLE 40 MG PO CPDR
40.0000 mg | DELAYED_RELEASE_CAPSULE | Freq: Two times a day (BID) | ORAL | 3 refills | Status: DC
Start: 2019-06-24 — End: 2021-06-09

## 2019-06-24 NOTE — Progress Notes (Signed)
HISTORY OF PRESENT ILLNESS:  Holly Kaufman is a 64 y.o. female with a history of GERD complicated by erosive esophagitis and peptic stricture requiring esophageal dilation as well as multiple adenomatous colon polyps presents today for follow-up regarding management of her chronic GERD.  She last underwent surveillance colonoscopy May 2018.  Follow-up in 5 years recommended.  She last underwent upper endoscopy July 2018.  Found to have erosive esophagitis despite omeprazole 40 mg daily.  Peptic stricture was dilated.  PPI increased to twice daily.  Subsequent office follow-up on the patient doing well November 2018.  She has not been seen since.  Tells me that her reflux symptoms have been significantly better on twice daily omeprazole.  She has had some mild recurrence of dysphagia recently and wonders about repeat esophageal dilation.  Otherwise GI review of systems is negative.  REVIEW OF SYSTEMS:  All non-GI ROS negative unless otherwise stated in the HPI except for back pain, arthritis, night sweats  Past Medical History:  Diagnosis Date  . Acute sinusitis 01/22/2015  . Allergy   . Anemia   . Anxiety    celexa occasionally  . Arthritis    hands  . Back pain 04/14/2013  . Cervical cancer screening 07/10/2008   Qualifier: Diagnosis of  By: Loanne Drilling MD, Jacelyn Pi   . Chicken pox as a child  . Colon polyps    adenomatous  . Depression with anxiety 02/01/2015  . Diverticulosis   . Esophageal stricture   . GERD (gastroesophageal reflux disease)   . [place] measles as a child  . Hiatal hernia   . Hyperlipidemia    Patient denies that she has hyperlipidemia.  . Internal hemorrhoids   . Measles as a child  . Mumps as a child  . PONV (postoperative nausea and vomiting)   . Shingles 64 yrs old  . Thyroid disease    hypothyroid  . Ulcer     Past Surgical History:  Procedure Laterality Date  . COLONOSCOPY    . POLYPECTOMY    . UPPER GASTROINTESTINAL ENDOSCOPY    . VAGINAL HYSTERECTOMY   63 yrs old   partial  . WISDOM TOOTH EXTRACTION  64 yrs old    Social History Holly Kaufman  reports that she has never smoked. She has never used smokeless tobacco. She reports that she does not drink alcohol or use drugs.  family history includes CVA in her mother; Diabetes in her maternal grandfather and paternal grandmother; Heart disease in her maternal grandfather and mother; Hyperlipidemia in her mother and sister; Other in her father; Stomach cancer in her paternal uncle; Stroke in her maternal grandmother and paternal grandfather.  Allergies  Allergen Reactions  . Prochlorperazine Edisylate     "heads rolls back and eyes roll back"-compazine       PHYSICAL EXAMINATION: Vital signs: There were no vitals taken for this visit.  Constitutional: generally well-appearing, no acute distress Psychiatric: alert and oriented x3, cooperative Eyes: extraocular movements intact, anicteric, conjunctiva pink Mouth: oral pharynx moist, no lesions Neck: supple no lymphadenopathy Cardiovascular: heart regular rate and rhythm, no murmur Lungs: clear to auscultation bilaterally Abdomen: soft, nontender, nondistended, no obvious ascites, no peritoneal signs, normal bowel sounds, no organomegaly Rectal: Omitted Extremities: no clubbing, cyanosis, or lower extremity edema bilaterally Skin: no lesions on visible extremities Neuro: No focal deficits.  Cranial nerves intact  ASSESSMENT:  1.  GERD complicated by erosive esophagitis peptic stricture.  GERD symptoms controlled with omeprazole 40 mg twice  daily.  Now some mild definite recurrent dysphagia likely due to recurrent peptic stricture. 2.  History of multiple adenomatous colon polyps.  Surveillance up-to-date   PLAN:  1.  Reflux precautions 2.  Continue omeprazole 40 mg daily.  Prescription refilled.  Medication risks reviewed 3.  Schedule upper endoscopy with esophageal dilation.The nature of the procedure, as well as the risks,  benefits, and alternatives were carefully and thoroughly reviewed with the patient. Ample time for discussion and questions allowed. The patient understood, was satisfied, and agreed to proceed. 4.  Surveillance colonoscopy around May 2023.  Patient aware

## 2019-06-24 NOTE — Patient Instructions (Signed)
We have sent the following medications to your pharmacy for you to pick up at your convenience:  Omeprazole  You have been scheduled for an endoscopy. Please follow written instructions given to you at your visit today. If you use inhalers (even only as needed), please bring them with you on the day of your procedure.

## 2019-07-02 ENCOUNTER — Ambulatory Visit: Payer: 59 | Admitting: Internal Medicine

## 2019-07-15 ENCOUNTER — Telehealth: Payer: Self-pay | Admitting: Internal Medicine

## 2019-07-15 NOTE — Telephone Encounter (Signed)
Pt Is requesting a call back from a nurse. Pt is having an EGD done on 7/2 but has recently had gum surgery and has two stitches left on the left side of her mouth, pt is wanting to know if it is safe to continue with procedure. Pt is on antibiotics for two more days

## 2019-07-15 NOTE — Telephone Encounter (Signed)
Reschedule after mouth has healed

## 2019-07-15 NOTE — Telephone Encounter (Signed)
Pt had Gum surgery done 9 days ago. They did a skin graft from the roof of her mouth and grafted to the gums on the lower jaw on the pts left side. She still has stiches on the left side, the stitches from the roof of her mouth are gone. Pt wanted to check and see if safe to proceed with EGD on Friday. Her mouth is still sore and she does not want the incision in the roof of her mouth to open back up. Please advise.

## 2019-07-15 NOTE — Telephone Encounter (Signed)
Pt aware and EGD rescheduled to 08/13/19@10am . Pt aware of appt and knows to arrive at 9am and be NPO after midnight.

## 2019-07-19 ENCOUNTER — Encounter: Payer: BC Managed Care – PPO | Admitting: Internal Medicine

## 2019-08-13 ENCOUNTER — Ambulatory Visit (AMBULATORY_SURGERY_CENTER): Payer: BC Managed Care – PPO | Admitting: Internal Medicine

## 2019-08-13 ENCOUNTER — Other Ambulatory Visit: Payer: Self-pay

## 2019-08-13 ENCOUNTER — Encounter: Payer: Self-pay | Admitting: Internal Medicine

## 2019-08-13 VITALS — BP 111/87 | HR 52 | Temp 97.1°F | Resp 16 | Ht 61.0 in | Wt 132.0 lb

## 2019-08-13 DIAGNOSIS — R1310 Dysphagia, unspecified: Secondary | ICD-10-CM | POA: Diagnosis not present

## 2019-08-13 DIAGNOSIS — K222 Esophageal obstruction: Secondary | ICD-10-CM | POA: Diagnosis not present

## 2019-08-13 DIAGNOSIS — R1319 Other dysphagia: Secondary | ICD-10-CM

## 2019-08-13 DIAGNOSIS — K2100 Gastro-esophageal reflux disease with esophagitis, without bleeding: Secondary | ICD-10-CM

## 2019-08-13 MED ORDER — SODIUM CHLORIDE 0.9 % IV SOLN: 500.0000 mL | Freq: Once | INTRAVENOUS | Status: DC

## 2019-08-13 NOTE — Op Note (Signed)
[place] Patient Name: Holly Kaufman Procedure Date: 08/13/2019 10:36 AM MRN: [id number] Endoscopist: Docia Chuck. Henrene Pastor , MD Age: 64 Referring MD:  Date of Birth: 1955/05/06 Gender: Female Account #: [id number] Procedure:                Upper GI endoscopy with [facility] dilation of the                            esophagus. 64 [place] Indications:              Dysphagia Medicines:                Monitored Anesthesia Care Procedure:                Pre-Anesthesia Assessment:                           - Prior to the procedure, a History and Physical                            was performed, and patient medications and                            allergies were reviewed. The patient's tolerance of                            previous anesthesia was also reviewed. The risks                            and benefits of the procedure and the sedation                            options and risks were discussed with the patient.                            All questions were answered, and informed consent                            was obtained. Prior Anticoagulants: The patient has                            taken no previous anticoagulant or antiplatelet                            agents. ASA Grade Assessment: II - A patient with                            mild systemic disease. After reviewing the risks                            and benefits, the patient was deemed in                            satisfactory condition to undergo the procedure.  After obtaining informed consent, the endoscope was                            passed under direct vision. Throughout the                            procedure, the patient's blood pressure, pulse, and                            oxygen saturations were monitored continuously. The                            Endoscope was introduced through the mouth, and                            advanced to the second part of duodenum. The  upper                            GI endoscopy was accomplished without difficulty.                            The patient tolerated the procedure well. Scope In: Scope Out: Findings:                 One benign-appearing, intrinsic moderate stenosis                            was found 30 cm from the incisors. This stenosis                            measured 1.5 cm (inner diameter). The scope was                            withdrawn. Dilation was performed with a Maloney                            dilator with no resistance at 55 Fr.                           The exam of the esophagus was otherwise normal.                           The stomach revealed a large sliding hiatal hernia                            measuring approximately 7 to 8 cm. The stomach was                            otherwise normal.                           The examined duodenum was normal.                           The  cardia and gastric fundus were normal on                            retroflexion, with the large hernia observed. Complications:            No immediate complications. Estimated Blood Loss:     Estimated blood loss: none. Impression:               1. Esophageal stricture status post dilation to 82                            French Maloney                           2. Large hiatal hernia. Recommendation:           - Patient has a contact number available for                            emergencies. The signs and symptoms of potential                            delayed complications were discussed with the                            patient. Return to normal activities tomorrow.                            Written discharge instructions were provided to the                            patient.                           - Post dilation diet.                           - Continue present medications.                           - Please contact Dr. Henrene Pastor with swallowing problems                             persist would consider balloon dilation next                            session if necessary Gerrit Rafalski N. Henrene Pastor, MD 08/13/2019 10:57:56 AM This report has been signed electronically.

## 2019-08-13 NOTE — Progress Notes (Signed)
V/S-CW  Check-in-JB

## 2019-08-13 NOTE — Progress Notes (Signed)
Called to room to assist during endoscopic procedure.  Patient ID and intended procedure confirmed with present staff. Received instructions for my participation in the procedure from the performing physician.  

## 2019-08-13 NOTE — Progress Notes (Signed)
Report to PACU, RN, vss, BBS= Clear.  

## 2019-08-13 NOTE — Patient Instructions (Signed)
Handouts Provided:  Post Dilation Diet  YOU HAD AN ENDOSCOPIC PROCEDURE TODAY AT THE [facility]:   Refer to the procedure report that was given to you for any specific questions about what was found during the examination.  If the procedure report does not answer your questions, please call your gastroenterologist to clarify.  If you requested that your care partner not be given the details of your procedure findings, then the procedure report has been included in a sealed envelope for you to review at your convenience later.  YOU SHOULD EXPECT: Some feelings of bloating in the abdomen. Passage of more gas than usual.  Walking can help get rid of the air that was put into your GI tract during the procedure and reduce the bloating. If you had a lower endoscopy (such as a colonoscopy or flexible sigmoidoscopy) you may notice spotting of blood in your stool or on the toilet paper. If you underwent a bowel prep for your procedure, you may not have a normal bowel movement for a few days.  Please Note:  You might notice some irritation and congestion in your nose or some drainage.  This is from the oxygen used during your procedure.  There is no need for concern and it should clear up in a day or so.  SYMPTOMS TO REPORT IMMEDIATELY:  Following upper endoscopy (EGD)  Vomiting of blood or coffee ground material  New chest pain or pain under the shoulder blades  Painful or persistently difficult swallowing  New shortness of breath  Fever of 100F or higher  Black, tarry-looking stools  For urgent or emergent issues, a gastroenterologist can be reached at any hour by calling [phone]. Do not use MyChart messaging for urgent concerns.    DIET:  See Post Dilation diet provided.  Drink plenty of fluids but you should avoid alcoholic beverages for 24 hours.  ACTIVITY:  You should plan to take it easy for the rest of today and you should NOT DRIVE or use heavy machinery until tomorrow  (because of the sedation medicines used during the test).    FOLLOW UP: Our staff will call the number listed on your records 48-72 hours following your procedure to check on you and address any questions or concerns that you may have regarding the information given to you following your procedure. If we do not reach you, we will leave a message.  We will attempt to reach you two times.  During this call, we will ask if you have developed any symptoms of COVID 19. If you develop any symptoms (ie: fever, flu-like symptoms, shortness of breath, cough etc.) before then, please call [phone].  If you test positive for Covid 19 in the 2 weeks post procedure, please call and report this information to us.    If any biopsies were taken you will be contacted by phone or by letter within the next 1-3 weeks.  Please call us at [phone] if you have not heard about the biopsies in 3 weeks.    SIGNATURES/CONFIDENTIALITY: You and/or your care partner have signed paperwork which will be entered into your electronic medical record.  These signatures attest to the fact that that the information above on your After Visit Summary has been reviewed and is understood.  Full responsibility of the confidentiality of this discharge information lies with you and/or your care-partner.  

## 2019-08-15 ENCOUNTER — Telehealth: Payer: Self-pay

## 2019-08-15 NOTE — Telephone Encounter (Signed)
1st attempt to contact patient post op left voicemessage

## 2019-12-02 ENCOUNTER — Ambulatory Visit
Admission: RE | Admit: 2019-12-02 | Discharge: 2019-12-02 | Disposition: A | Discharge status: Home / Self Care | Payer: BC Managed Care – PPO | Source: Ambulatory Visit | Attending: Family Medicine | Admitting: Family Medicine

## 2019-12-02 ENCOUNTER — Other Ambulatory Visit: Payer: Self-pay

## 2019-12-02 DIAGNOSIS — N6320 Unspecified lump in the left breast, unspecified quadrant: Secondary | ICD-10-CM

## 2020-06-12 ENCOUNTER — Other Ambulatory Visit: Payer: Self-pay | Admitting: Family Medicine

## 2020-06-12 DIAGNOSIS — Z1231 Encounter for screening mammogram for malignant neoplasm of breast: Secondary | ICD-10-CM

## 2020-06-18 ENCOUNTER — Other Ambulatory Visit: Payer: Self-pay

## 2020-06-18 ENCOUNTER — Ambulatory Visit
Admission: RE | Admit: 2020-06-18 | Discharge: 2020-06-18 | Disposition: A | Discharge status: Home / Self Care | Payer: BC Managed Care – PPO | Source: Ambulatory Visit | Attending: Family Medicine | Admitting: Family Medicine

## 2020-06-18 DIAGNOSIS — Z1231 Encounter for screening mammogram for malignant neoplasm of breast: Secondary | ICD-10-CM

## 2021-06-09 ENCOUNTER — Ambulatory Visit: Payer: Medicare Other | Admitting: Internal Medicine

## 2021-06-09 ENCOUNTER — Encounter: Payer: Self-pay | Admitting: Internal Medicine

## 2021-06-09 VITALS — BP 112/68 | HR 59 | Ht 61.0 in | Wt 132.0 lb

## 2021-06-09 DIAGNOSIS — Z86010 Personal history of colonic polyps: Secondary | ICD-10-CM

## 2021-06-09 DIAGNOSIS — K2100 Gastro-esophageal reflux disease with esophagitis, without bleeding: Secondary | ICD-10-CM | POA: Diagnosis not present

## 2021-06-09 DIAGNOSIS — K222 Esophageal obstruction: Secondary | ICD-10-CM

## 2021-06-09 DIAGNOSIS — R1319 Other dysphagia: Secondary | ICD-10-CM

## 2021-06-09 MED ORDER — OMEPRAZOLE 40 MG PO CPDR: 40.0000 mg | DELAYED_RELEASE_CAPSULE | Freq: Two times a day (BID) | ORAL | 3 refills | Status: AC

## 2021-06-09 MED ORDER — SUTAB 1479-225-188 MG PO TABS: 1.0000 | ORAL_TABLET | Freq: Once | ORAL | 0 refills | Status: AC

## 2021-06-09 NOTE — Patient Instructions (Signed)
If you are age 67 or older, your body mass index should be between 23-30. Your Body mass index is 24.94 kg/m. If this is out of the aforementioned range listed, please consider follow up with your Primary Care Provider.  If you are age 19 or younger, your body mass index should be between 19-25. Your Body mass index is 24.94 kg/m. If this is out of the aformentioned range listed, please consider follow up with your Primary Care Provider.   ________________________________________________________  The [facility] providers would like to encourage you to use [facility] to communicate with providers for non-urgent requests or questions.  Due to long hold times on the telephone, sending your provider a message by [facility] may be a faster and more efficient way to get a response.  Please allow 48 business hours for a response.  Please remember that this is for non-urgent requests.  _______________________________________________________  We have sent the following medications to your pharmacy for you to pick up at your convenience:  Omeprazole  You have been scheduled for an endoscopy and colonoscopy. Please follow the written instructions given to you at your visit today. Please pick up your prep supplies at the pharmacy within the next 1-3 days. If you use inhalers (even only as needed), please bring them with you on the day of your procedure.

## 2021-06-09 NOTE — Progress Notes (Signed)
HISTORY OF PRESENT ILLNESS:  Holly Kaufman is a 66 y.o. female with a history of GERD complicated by erosive esophagitis and peptic stricture requiring esophageal dilation.  She also has a history of multiple adenomatous colon polyps for which she is under a colonoscopy surveillance program.  She presents today for follow-up regarding the need for surveillance colonoscopy and requests upper endoscopy to address recurrent issues with dysphagia.  She has undergone colonoscopy in 2003, 2008, 2013, and 2018.  She has a history of multiple adenomatous colon polyps.  As well sigmoid diverticulosis and hemorrhoids.  She is due for surveillance colonoscopy at this time.  She last underwent upper endoscopy with esophageal dilation ([address]) July 2021.  She has a large hiatal hernia.  For her GERD she is maintained on omeprazole 40 mg twice daily.  Overall she is doing well.  She continues on twice daily PPI with good control of reflux symptoms.  However, she has had recurrent intermittent solid food dysphagia over the past 7 months.  She feels that she would benefit from repeat esophageal dilation.  No lower GI complaints.  She does request medication refill as well.  REVIEW OF SYSTEMS:  All non-GI ROS negative unless otherwise stated in the HPI except for arthritis  Past Medical History:  Diagnosis Date   Acute sinusitis 01/22/2015   Allergy    Anemia    Anxiety    celexa occasionally   Arthritis    hands   Back pain 04/14/2013   Cervical cancer screening 07/10/2008   Qualifier: Diagnosis of  By: Loanne Drilling MD, Sean A    Chicken pox as a child   Colon polyps    adenomatous   Depression with anxiety 02/01/2015   Diverticulosis    Esophageal stricture    GERD (gastroesophageal reflux disease)    [place] measles as a child   Hiatal hernia    Hyperlipidemia    Patient denies that she has hyperlipidemia.   Internal hemorrhoids    Measles as a child   Mumps as a child   PONV (postoperative  nausea and vomiting)    Shingles 65 yrs old   Thyroid disease    hypothyroid   Ulcer     Past Surgical History:  Procedure Laterality Date   COLONOSCOPY     POLYPECTOMY     UPPER GASTROINTESTINAL ENDOSCOPY     VAGINAL HYSTERECTOMY  66 yrs old   partial   WISDOM TOOTH EXTRACTION  66 yrs old    Social History KATHLYN LEACHMAN  reports that she has never smoked. She has never used smokeless tobacco. She reports current alcohol use. She reports that she does not use drugs.  family history includes CVA in her mother; Diabetes in her maternal grandfather and paternal grandmother; Heart disease in her maternal grandfather and mother; Hyperlipidemia in her mother and sister; Other in her father; Stomach cancer in her paternal uncle; Stroke in her maternal grandmother and paternal grandfather.  Allergies  Allergen Reactions   Prochlorperazine Edisylate     "heads rolls back and eyes roll back"-compazine       PHYSICAL EXAMINATION: Vital signs: BP 112/68   Pulse (!) 59   Ht '5\' 1"'$  (1.549 m)   Wt 132 lb (59.9 kg)   SpO2 96%   BMI 24.94 kg/m   Constitutional: generally well-appearing, no acute distress Psychiatric: alert and oriented x3, cooperative Eyes: extraocular movements intact, anicteric, conjunctiva pink Mouth: oral pharynx moist, no lesions Neck: supple no  lymphadenopathy Cardiovascular: heart regular rate and rhythm, no murmur Lungs: clear to auscultation bilaterally Abdomen: soft, nontender, nondistended, no obvious ascites, no peritoneal signs, normal bowel sounds, no organomegaly Rectal: Deferred to colonoscopy Extremities: no clubbing, cyanosis, or lower extremity edema bilaterally Skin: no lesions on visible extremities Neuro: No focal deficits.  Cranial nerves intact  ASSESSMENT:  1.  Chronic GERD.  Requires twice daily PPI to control symptoms. 2.  Esophageal stricture.  Has required intermittent dilation. 3.  Recurrent dysphagia secondary to esophageal  stricture.  Will require repeat dilation. 4.  History of multiple adenomatous colon polyps.  Multiple prior colonoscopies.  Last examination 2018.  Due for follow-up at this time 5.  General medical problems.  Stable   PLAN:  1.  Reflux precautions 2.  Refill omeprazole 40 mg twice daily.  Medication risk reviewed 3.  Schedule upper endoscopy with esophageal dilation.The nature of the procedure, as well as the risks, benefits, and alternatives were carefully and thoroughly reviewed with the patient. Ample time for discussion and questions allowed. The patient understood, was satisfied, and agreed to proceed.  4.  Schedule surveillance colonoscopy with polypectomy if indicated.The nature of the procedure, as well as the risks, benefits, and alternatives were carefully and thoroughly reviewed with the patient. Ample time for discussion and questions allowed. The patient understood, was satisfied, and agreed to proceed.

## 2021-06-21 ENCOUNTER — Encounter: Payer: Self-pay | Admitting: Internal Medicine

## 2021-06-21 ENCOUNTER — Ambulatory Visit (AMBULATORY_SURGERY_CENTER): Payer: Medicare Other | Admitting: Internal Medicine

## 2021-06-21 VITALS — BP 108/69 | HR 54 | Temp 97.3°F | Resp 16 | Ht 61.0 in | Wt 132.0 lb

## 2021-06-21 DIAGNOSIS — K222 Esophageal obstruction: Secondary | ICD-10-CM

## 2021-06-21 DIAGNOSIS — K449 Diaphragmatic hernia without obstruction or gangrene: Secondary | ICD-10-CM

## 2021-06-21 DIAGNOSIS — K219 Gastro-esophageal reflux disease without esophagitis: Secondary | ICD-10-CM | POA: Diagnosis not present

## 2021-06-21 DIAGNOSIS — Z09 Encounter for follow-up examination after completed treatment for conditions other than malignant neoplasm: Secondary | ICD-10-CM | POA: Diagnosis present

## 2021-06-21 DIAGNOSIS — Z86010 Personal history of colonic polyps: Secondary | ICD-10-CM | POA: Diagnosis not present

## 2021-06-21 DIAGNOSIS — R1319 Other dysphagia: Secondary | ICD-10-CM

## 2021-06-21 DIAGNOSIS — K2100 Gastro-esophageal reflux disease with esophagitis, without bleeding: Secondary | ICD-10-CM

## 2021-06-21 DIAGNOSIS — D123 Benign neoplasm of transverse colon: Secondary | ICD-10-CM

## 2021-06-21 MED ORDER — SODIUM CHLORIDE 0.9 % IV SOLN: 500.0000 mL | Freq: Once | INTRAVENOUS | Status: AC

## 2021-06-21 NOTE — Progress Notes (Signed)
Pt's states no medical or surgical changes since previsit or office visit. 

## 2021-06-21 NOTE — Patient Instructions (Signed)
Impression/Recommendations:  Dilation diet, polyp, diverticulosis, and hemorrhoid handouts given to patient.  Repeat colonoscopy in 5 years for surveillance.  Routine office follow-up in 1 years.    Resume previous diet. Continue present medications. Await pathology results.  Contact the office in the interim for any questions or problems.  YOU HAD AN ENDOSCOPIC PROCEDURE TODAY AT [facility]:   Refer to the procedure report that was given to you for any specific questions about what was found during the examination.  If the procedure report does not answer your questions, please call your gastroenterologist to clarify.  If you requested that your care partner not be given the details of your procedure findings, then the procedure report has been included in a sealed envelope for you to review at your convenience later.  YOU SHOULD EXPECT: Some feelings of bloating in the abdomen. Passage of more gas than usual.  Walking can help get rid of the air that was put into your GI tract during the procedure and reduce the bloating. If you had a lower endoscopy (such as a colonoscopy or flexible sigmoidoscopy) you may notice spotting of blood in your stool or on the toilet paper. If you underwent a bowel prep for your procedure, you may not have a normal bowel movement for a few days.  Please Note:  You might notice some irritation and congestion in your nose or some drainage.  This is from the oxygen used during your procedure.  There is no need for concern and it should clear up in a day or so.  SYMPTOMS TO REPORT IMMEDIATELY:  Following lower endoscopy (colonoscopy or flexible sigmoidoscopy):  Excessive amounts of blood in the stool  Significant tenderness or worsening of abdominal pains  Swelling of the abdomen that is new, acute  Fever of 100F or higher  Following upper endoscopy (EGD)  Vomiting of blood or coffee ground material  New chest pain or pain under the shoulder  blades  Painful or persistently difficult swallowing  New shortness of breath  Fever of 100F or higher  Black, tarry-looking stools  For urgent or emergent issues, a gastroenterologist can be reached at any hour by calling [phone]. Do not use MyChart messaging for urgent concerns.    DIET:  We do recommend a small meal at first, but then you may proceed to your regular diet.  Drink plenty of fluids but you should avoid alcoholic beverages for 24 hours.  ACTIVITY:  You should plan to take it easy for the rest of today and you should NOT DRIVE or use heavy machinery until tomorrow (because of the sedation medicines used during the test).    FOLLOW UP: Our staff will call the number listed on your records 24-72 hours following your procedure to check on you and address any questions or concerns that you may have regarding the information given to you following your procedure. If we do not reach you, we will leave a message.  We will attempt to reach you two times.  During this call, we will ask if you have developed any symptoms of COVID 19. If you develop any symptoms (ie: fever, flu-like symptoms, shortness of breath, cough etc.) before then, please call [phone].  If you test positive for Covid 19 in the 2 weeks post procedure, please call and report this information to [place].    If any biopsies were taken you will be contacted by phone or by letter within the next 1-3 weeks.  Please  call us at [phone] if you have not heard about the biopsies in 3 weeks.    SIGNATURES/CONFIDENTIALITY: You and/or your care partner have signed paperwork which will be entered into your electronic medical record.  These signatures attest to the fact that that the information above on your After Visit Summary has been reviewed and is understood.  Full responsibility of the confidentiality of this discharge information lies with you and/or your care-partner.

## 2021-06-21 NOTE — Op Note (Signed)
[facility] Patient Name: Holly Kaufman Procedure Date: 06/21/2021 8:02 AM MRN: [id number] Endoscopist: Docia Chuck. Henrene Pastor , MD Age: 66 Referring MD:  Date of Birth: 1955/02/19 Gender: Female Account #: [id number] Procedure:                Upper GI endoscopy with [facility] dilation of the                            esophagus. 35 [place] Indications:              Dysphagia, Therapeutic procedure, Esophageal reflux Medicines:                Monitored Anesthesia Care Procedure:                Pre-Anesthesia Assessment:                           - Prior to the procedure, a History and Physical                            was performed, and patient medications and                            allergies were reviewed. The patient's tolerance of                            previous anesthesia was also reviewed. The risks                            and benefits of the procedure and the sedation                            options and risks were discussed with the patient.                            All questions were answered, and informed consent                            was obtained. Prior Anticoagulants: The patient has                            taken no previous anticoagulant or antiplatelet                            agents. ASA Grade Assessment: II - A patient with                            mild systemic disease. After reviewing the risks                            and benefits, the patient was deemed in                            satisfactory condition to undergo the procedure.  After obtaining informed consent, the endoscope was                            passed under direct vision. Throughout the                            procedure, the patient's blood pressure, pulse, and                            oxygen saturations were monitored continuously. The                            GIF [id number] was introduced through the                            mouth,  and advanced to the second part of duodenum.                            The upper GI endoscopy was accomplished without                            difficulty. The patient tolerated the procedure                            well. Scope In: Scope Out: Findings:                 One benign-appearing, intrinsic moderate stenosis                            was found 30 cm from the incisors. This stenosis                            measured 1.5 cm (inner diameter). The scope was                            withdrawn. Dilation was performed with a Maloney                            dilator with no resistance at 13 Fr.                           The exam of the esophagus was otherwise normal.                           The stomach revealed a large hiatal hernia. No                            other abnormalities.                           The examined duodenum was normal.                           The cardia and gastric fundus were normal on  retroflexion. Complications:            No immediate complications. Estimated Blood Loss:     Estimated blood loss: none. Impression:               - Benign-appearing esophageal stenosis. Dilated.                           - Normal stomach save large hiatal hernia.                           - Normal examined duodenum.                           - No specimens collected. Recommendation:           - Patient has a contact number available for                            emergencies. The signs and symptoms of potential                            delayed complications were discussed with the                            patient. Return to normal activities tomorrow.                            Written discharge instructions were provided to the                            patient.                           - Post dilation diet.                           - Continue present medications.                           - Routine office follow-up 1 year.  Contact the                            office in the interim for any questions or problems Cole Eastridge N. Henrene Pastor, MD 06/21/2021 8:49:50 AM This report has been signed electronically.

## 2021-06-21 NOTE — Progress Notes (Signed)
A and O x3. Report to RN. Tolerated MAC anesthesia well.Teeth unchanged after procedure. 

## 2021-06-21 NOTE — Op Note (Signed)
[facility] Patient Name: Holly Kaufman Procedure Date: 06/21/2021 8:03 AM MRN: [id number] Endoscopist: Docia Chuck. Henrene Pastor , MD Age: 66 Referring MD:  Date of Birth: 01-Jul-1955 Gender: Female Account #: [id number] Procedure:                Colonoscopy with cold snare polypectomy x 1 Indications:              High risk colon cancer surveillance: Personal                            history of multiple (3 or more) adenomas. Previous                            examinations 2003, 2008, 2013, 2018 Medicines:                Monitored Anesthesia Care Procedure:                Pre-Anesthesia Assessment:                           - Prior to the procedure, a History and Physical                            was performed, and patient medications and                            allergies were reviewed. The patient's tolerance of                            previous anesthesia was also reviewed. The risks                            and benefits of the procedure and the sedation                            options and risks were discussed with the patient.                            All questions were answered, and informed consent                            was obtained. Prior Anticoagulants: The patient has                            taken no previous anticoagulant or antiplatelet                            agents. ASA Grade Assessment: II - A patient with                            mild systemic disease. After reviewing the risks                            and benefits, the patient was deemed in  satisfactory condition to undergo the procedure.                           After obtaining informed consent, the colonoscope                            was passed under direct vision. Throughout the                            procedure, the patient's blood pressure, pulse, and                            oxygen saturations were monitored continuously. The                             Olympus CF-[id number] ([id number]) Colonoscope was                            introduced through the anus and advanced to the the                            cecum, identified by appendiceal orifice and                            ileocecal valve. The ileocecal valve, appendiceal                            orifice, and rectum were photographed. The quality                            of the bowel preparation was excellent. The                            colonoscopy was performed without difficulty. The                            patient tolerated the procedure well. The bowel                            preparation used was SUPREP via split dose                            instruction. Scope In: 8:17:48 AM Scope Out: 8:31:54 AM Scope Withdrawal Time: 0 hours 11 minutes 57 seconds  Total Procedure Duration: 0 hours 14 minutes 6 seconds  Findings:                 A 5 mm polyp was found in the transverse colon. The                            polyp was removed with a cold snare. Resection and                            retrieval were complete.  Multiple diverticula were found in the sigmoid                            colon.                           Internal hemorrhoids were found during retroflexion.                           The exam was otherwise without abnormality on                            direct and retroflexion views. Complications:            No immediate complications. Estimated blood loss:                            None. Estimated Blood Loss:     Estimated blood loss: none. Impression:               - One 5 mm polyp in the transverse colon, removed                            with a cold snare. Resected and retrieved.                           - Diverticulosis in the sigmoid colon.                           - Internal hemorrhoids.                           - The examination was otherwise normal on direct                            and retroflexion  views. Recommendation:           - Repeat colonoscopy in 5 years for surveillance.                           - Patient has a contact number available for                            emergencies. The signs and symptoms of potential                            delayed complications were discussed with the                            patient. Return to normal activities tomorrow.                            Written discharge instructions were provided to the                            patient.                           -  Resume previous diet.                           - Continue present medications.                           - Await pathology results. Docia Chuck. Henrene Pastor, MD 06/21/2021 8:44:12 AM This report has been signed electronically.

## 2021-06-21 NOTE — Progress Notes (Signed)
Called to room to assist during endoscopic procedure.  Patient ID and intended procedure confirmed with present staff. Received instructions for my participation in the procedure from the performing physician.  

## 2021-06-21 NOTE — Progress Notes (Signed)
[facility] reported a nick on pt's lower left lip.  No discoloration or swelling noted.  Superficial 1/4" cut in inside lower left lip noted.  Told pt. And care partner to apply vaseline at home for comfort of lip.

## 2021-06-22 ENCOUNTER — Telehealth: Payer: Self-pay | Admitting: *Deleted

## 2021-06-22 NOTE — Telephone Encounter (Signed)
No answer on first attempt follow up call. Left message.  ?

## 2021-06-22 NOTE — Telephone Encounter (Signed)
Second attempt, left VM.  

## 2021-06-24 ENCOUNTER — Encounter: Payer: Self-pay | Admitting: Internal Medicine

## 2021-07-30 ENCOUNTER — Other Ambulatory Visit: Payer: Self-pay | Admitting: Family Medicine

## 2021-07-30 DIAGNOSIS — Z1231 Encounter for screening mammogram for malignant neoplasm of breast: Secondary | ICD-10-CM

## 2021-08-04 ENCOUNTER — Ambulatory Visit
Admission: RE | Admit: 2021-08-04 | Discharge: 2021-08-04 | Disposition: A | Discharge status: Home / Self Care | Payer: Medicare Other | Source: Ambulatory Visit | Attending: Family Medicine | Admitting: Family Medicine

## 2021-08-04 DIAGNOSIS — Z1231 Encounter for screening mammogram for malignant neoplasm of breast: Secondary | ICD-10-CM

## 2022-01-24 ENCOUNTER — Telehealth: Payer: Self-pay | Admitting: Internal Medicine

## 2022-01-24 NOTE — Telephone Encounter (Signed)
1.  Make sure she is taking her PPI twice daily 2.  If so, add famotidine 40 mg at bedtime 3.  Set her up for upper endoscopy with possible esophageal dilation, in the [place] Thanks Dr. Henrene Pastor

## 2022-01-24 NOTE — Telephone Encounter (Signed)
Left message for pt to call back.  Pt states that since her last EGD she has been having issues with feeling like she is going to throw up. She reports it has started happening about every day, she feels more saliva in her mouth and she has to stop eating, feels like things are "backing up." Reports if she can burp a few times it calms down a little. She wanted to know what Dr. Henrene Pastor recommends. Please advise.

## 2022-01-24 NOTE — Telephone Encounter (Signed)
Patient calling stating she has been having complications trying to keep food down after eating. States the issues has been happening everyday since surgery.  Please advise. Thank you

## 2022-01-25 ENCOUNTER — Other Ambulatory Visit: Payer: Self-pay

## 2022-01-25 DIAGNOSIS — K2100 Gastro-esophageal reflux disease with esophagitis, without bleeding: Secondary | ICD-10-CM

## 2022-01-25 DIAGNOSIS — R1310 Dysphagia, unspecified: Secondary | ICD-10-CM

## 2022-01-25 MED ORDER — FAMOTIDINE 40 MG PO TABS: 40.0000 mg | ORAL_TABLET | Freq: Every day | ORAL | 2 refills | Status: DC

## 2022-01-25 NOTE — Telephone Encounter (Signed)
Left message for pt to call back.  Spoke with pt and she states she is taking her PPI. Script sent to pharmacy for pepcid. Pt scheduled for EGD with possible dil 02/11/22 at 10am., pt to arrive here at 9am. Amb ref in epic . Instructions sent to pt via mychart. She is aware.

## 2022-02-11 ENCOUNTER — Encounter: Payer: Self-pay | Admitting: Internal Medicine

## 2022-02-11 ENCOUNTER — Ambulatory Visit (AMBULATORY_SURGERY_CENTER): Payer: Medicare Other | Admitting: Internal Medicine

## 2022-02-11 VITALS — BP 100/65 | HR 54 | Temp 97.3°F | Resp 12 | Ht 60.0 in | Wt 128.8 lb

## 2022-02-11 DIAGNOSIS — R1310 Dysphagia, unspecified: Secondary | ICD-10-CM

## 2022-02-11 DIAGNOSIS — K2100 Gastro-esophageal reflux disease with esophagitis, without bleeding: Secondary | ICD-10-CM

## 2022-02-11 DIAGNOSIS — K222 Esophageal obstruction: Secondary | ICD-10-CM | POA: Diagnosis not present

## 2022-02-11 MED ORDER — SODIUM CHLORIDE 0.9 % IV SOLN: 500.0000 mL | Freq: Once | INTRAVENOUS | Status: DC

## 2022-02-11 NOTE — Progress Notes (Signed)
VS completed by DT.  Pt's states no medical or surgical changes since previsit or office visit.  

## 2022-02-11 NOTE — Progress Notes (Signed)
Called to room to assist during endoscopic procedure.  Patient ID and intended procedure confirmed with present staff. Received instructions for my participation in the procedure from the performing physician.  

## 2022-02-11 NOTE — Patient Instructions (Signed)
Discharge instructions given. Handouts on Hiatal Hernia and a Dilatation diet. Resume previous medications. YOU HAD AN ENDOSCOPIC PROCEDURE TODAY AT [facility]:   Refer to the procedure report that was given to you for any specific questions about what was found during the examination.  If the procedure report does not answer your questions, please call your gastroenterologist to clarify.  If you requested that your care partner not be given the details of your procedure findings, then the procedure report has been included in a sealed envelope for you to review at your convenience later.  YOU SHOULD EXPECT: Some feelings of bloating in the abdomen. Passage of more gas than usual.  Walking can help get rid of the air that was put into your GI tract during the procedure and reduce the bloating. If you had a lower endoscopy (such as a colonoscopy or flexible sigmoidoscopy) you may notice spotting of blood in your stool or on the toilet paper. If you underwent a bowel prep for your procedure, you may not have a normal bowel movement for a few days.  Please Note:  You might notice some irritation and congestion in your nose or some drainage.  This is from the oxygen used during your procedure.  There is no need for concern and it should clear up in a day or so.  SYMPTOMS TO REPORT IMMEDIATELY:   Following upper endoscopy (EGD)  Vomiting of blood or coffee ground material  New chest pain or pain under the shoulder blades  Painful or persistently difficult swallowing  New shortness of breath  Fever of 100F or higher  Black, tarry-looking stools  For urgent or emergent issues, a gastroenterologist can be reached at any hour by calling [phone]. Do not use MyChart messaging for urgent concerns.    DIET:  We do recommend a small meal at first, but then you may proceed to your regular diet.  Drink plenty of fluids but you should avoid alcoholic beverages for 24  hours.  ACTIVITY:  You should plan to take it easy for the rest of today and you should NOT DRIVE or use heavy machinery until tomorrow (because of the sedation medicines used during the test).    FOLLOW UP: Our staff will call the number listed on your records the next business day following your procedure.  We will call around 7:15- 8:00 am to check on you and address any questions or concerns that you may have regarding the information given to you following your procedure. If we do not reach you, we will leave a message.     If any biopsies were taken you will be contacted by phone or by letter within the next 1-3 weeks.  Please call us at [phone] if you have not heard about the biopsies in 3 weeks.    SIGNATURES/CONFIDENTIALITY: You and/or your care partner have signed paperwork which will be entered into your electronic medical record.  These signatures attest to the fact that that the information above on your After Visit Summary has been reviewed and is understood.  Full responsibility of the confidentiality of this discharge information lies with you and/or your care-partner.

## 2022-02-11 NOTE — Op Note (Signed)
[place] Patient Name: Holly Kaufman Procedure Date: 02/11/2022 9:53 AM MRN: [id number] Endoscopist: Docia Chuck. Henrene Pastor , MD, [id number] Age: 67 Referring MD:  Date of Birth: 11-13-1955 Gender: Female Account #: [id number] Procedure:                Upper GI endoscopy with balloon dilation of the                            esophagus. 18-20 mm Indications:              Dysphagia, Esophageal reflux Medicines:                Monitored Anesthesia Care Procedure:                Pre-Anesthesia Assessment:                           - Prior to the procedure, a History and Physical                            was performed, and patient medications and                            allergies were reviewed. The patient's tolerance of                            previous anesthesia was also reviewed. The risks                            and benefits of the procedure and the sedation                            options and risks were discussed with the patient.                            All questions were answered, and informed consent                            was obtained. Prior Anticoagulants: The patient has                            taken no anticoagulant or antiplatelet agents. ASA                            Grade Assessment: II - A patient with mild systemic                            disease. After reviewing the risks and benefits,                            the patient was deemed in satisfactory condition to                            undergo the procedure.  After obtaining informed consent, the endoscope was                            passed under direct vision. Throughout the                            procedure, the patient's blood pressure, pulse, and                            oxygen saturations were monitored continuously. The                            GIF [id number] #[id number] was introduced through the                            mouth, and advanced to the  second part of duodenum.                            The upper GI endoscopy was accomplished without                            difficulty. The patient tolerated the procedure                            well. Scope In: Scope Out: Findings:                 One benign-appearing, intrinsic moderate stenosis                            was found 26 cm from the incisors. This stenosis                            measured 1.5 cm (inner diameter). A TTS dilator was                            passed through the scope. Dilation with an 18-19-20                            mm balloon dilator was performed to 20 mm.                           The exam of the esophagus was otherwise normal.                           The stomach revealed a large hiatal hernia. The                            stomach was otherwise normal.                           The examined duodenum was normal.                           The cardia and  gastric fundus were normal on                            retroflexion. Complications:            No immediate complications. Estimated Blood Loss:     Estimated blood loss: none. Impression:               - Benign-appearing esophageal stenosis. Dilated.                           - Normal stomach, save large hiatal hernia.                           - Normal examined duodenum.                           - No specimens collected. Recommendation:           - Patient has a contact number available for                            emergencies. The signs and symptoms of potential                            delayed complications were discussed with the                            patient. Return to normal activities tomorrow.                            Written discharge instructions were provided to the                            patient.                           - Post dilation diet.                           - Continue present medications. Docia Chuck. Henrene Pastor, MD 02/11/2022 10:20:51 AM This report has  been signed electronically.

## 2022-02-11 NOTE — Progress Notes (Signed)
Report to PACU, RN, vss, BBS= Clear.  

## 2022-02-11 NOTE — Progress Notes (Signed)
HISTORY OF PRESENT ILLNESS:  Holly Kaufman is a 67 y.o. female with past medical history as listed below.  She has a history of GERD complicated by peptic stricture requiring esophageal dilation.  She contacted the office 2 weeks ago complaining of nausea worsening reflux and dysphagia.  Her acid suppressive regimen was adjusted.  She is now for upper endoscopy with probable esophageal dilation  REVIEW OF SYSTEMS:  All non-GI ROS negative except for  Past Medical History:  Diagnosis Date   Acute sinusitis 01/22/2015   Allergy    Anemia    Anxiety    celexa occasionally   Arthritis    hands   Back pain 04/14/2013   Cervical cancer screening 07/10/2008   Qualifier: Diagnosis of  By: Loanne Drilling MD, Sean A    Chicken pox as a child   Colon polyps    adenomatous   Depression with anxiety 02/01/2015   Diverticulosis    Esophageal stricture    GERD (gastroesophageal reflux disease)    [place] measles as a child   Hiatal hernia    Hyperlipidemia    Patient denies that she has hyperlipidemia.   Internal hemorrhoids    Measles as a child   Mumps as a child   PONV (postoperative nausea and vomiting)    Shingles 67 yrs old   Thyroid disease    hypothyroid   Ulcer     Past Surgical History:  Procedure Laterality Date   COLONOSCOPY     POLYPECTOMY     UPPER GASTROINTESTINAL ENDOSCOPY     VAGINAL HYSTERECTOMY  67 yrs old   partial   WISDOM TOOTH EXTRACTION  67 yrs old    Social History Holly Kaufman  reports that she has never smoked. She has never used smokeless tobacco. She reports current alcohol use. She reports that she does not use drugs.  family history includes CVA in her mother; Diabetes in her maternal grandfather and paternal grandmother; Heart disease in her maternal grandfather and mother; Hyperlipidemia in her mother and sister; Other in her father; Stomach cancer in her paternal uncle; Stroke in her maternal grandmother and paternal grandfather.  Allergies   Allergen Reactions   Prochlorperazine Other (See Comments)    "eyes roll back in my head" Involuntary head jerking/myoclonic spasms   Prochlorperazine Edisylate     "heads rolls back and eyes roll back"-compazine       PHYSICAL EXAMINATION: Vital signs: BP 116/64   Pulse 67   Temp (!) 97.3 F (36.3 C) (Temporal)   Ht 5' (1.524 m)   Wt 128 lb 12.8 oz (58.4 kg)   SpO2 99%   BMI 25.15 kg/m  General: Well-developed, well-nourished, no acute distress HEENT: Sclerae are anicteric, conjunctiva pink. Oral mucosa intact Lungs: Clear Heart: Regular Abdomen: soft, nontender, nondistended, no obvious ascites, no peritoneal signs, normal bowel sounds. No organomegaly. Extremities: No edema Psychiatric: alert and oriented x3. Cooperative     ASSESSMENT:  1.  GERD.  Deteriorated 2.  Dysphagia.  History of peptic stricture   PLAN:  1.  EGD with dilation

## 2022-02-14 ENCOUNTER — Telehealth: Payer: Self-pay

## 2022-02-14 NOTE — Telephone Encounter (Signed)
  Follow up Call-     02/11/2022    9:16 AM 06/21/2021    7:05 AM 08/13/2019    9:42 AM  Call back number  Post procedure Call Back phone  # [phone]  Permission to leave phone message Yes Yes Yes     Patient questions:  Do you have a fever, pain , or abdominal swelling? No. Pain Score  0 *  Have you tolerated food without any problems? Yes.    Have you been able to return to your normal activities? Yes.    Do you have any questions about your discharge instructions: Diet   No. Medications  Yes.   Follow up visit  No.  Do you have questions or concerns about your Care? No.  Actions: * If pain score is 4 or above: No action needed, pain <4.  Patient asked what medications for reflux is she supposed to take.  Per procedure report, she is to continue current medications which include omeprazole and pepcid.  She was informed of this, no other questions.

## 2022-03-21 ENCOUNTER — Encounter: Payer: Self-pay | Admitting: Internal Medicine

## 2022-04-22 ENCOUNTER — Other Ambulatory Visit: Payer: Self-pay | Admitting: Internal Medicine

## 2022-06-22 ENCOUNTER — Ambulatory Visit: Payer: Medicare Other | Admitting: Internal Medicine

## 2022-06-22 ENCOUNTER — Other Ambulatory Visit (INDEPENDENT_AMBULATORY_CARE_PROVIDER_SITE_OTHER): Payer: Medicare Other

## 2022-06-22 ENCOUNTER — Encounter: Payer: Self-pay | Admitting: Internal Medicine

## 2022-06-22 VITALS — BP 124/70 | HR 67 | Ht 60.0 in | Wt 127.0 lb

## 2022-06-22 DIAGNOSIS — R1013 Epigastric pain: Secondary | ICD-10-CM

## 2022-06-22 DIAGNOSIS — Z86010 Personal history of colonic polyps: Secondary | ICD-10-CM

## 2022-06-22 DIAGNOSIS — K2100 Gastro-esophageal reflux disease with esophagitis, without bleeding: Secondary | ICD-10-CM | POA: Diagnosis not present

## 2022-06-22 DIAGNOSIS — R1310 Dysphagia, unspecified: Secondary | ICD-10-CM | POA: Diagnosis not present

## 2022-06-22 DIAGNOSIS — K222 Esophageal obstruction: Secondary | ICD-10-CM

## 2022-06-22 LAB — CBC WITH DIFFERENTIAL/PLATELET
Basophils Absolute: 0.1 10*3/uL (ref 0.0–0.1)
Basophils Relative: 1.3 % (ref 0.0–3.0)
Eosinophils Absolute: 0.3 10*3/uL (ref 0.0–0.7)
Eosinophils Relative: 6.1 % — ABNORMAL HIGH (ref 0.0–5.0)
HCT: 36.5 % (ref 36.0–46.0)
Hemoglobin: 12 g/dL (ref 12.0–15.0)
Lymphocytes Relative: 37.3 % (ref 12.0–46.0)
Lymphs Abs: 1.9 10*3/uL (ref 0.7–4.0)
MCHC: 33 g/dL (ref 30.0–36.0)
MCV: 89.8 fl (ref 78.0–100.0)
Monocytes Absolute: 0.5 10*3/uL (ref 0.1–1.0)
Monocytes Relative: 8.9 % (ref 3.0–12.0)
Neutro Abs: 2.4 10*3/uL (ref 1.4–7.7)
Neutrophils Relative %: 46.4 % (ref 43.0–77.0)
Platelets: 278 10*3/uL (ref 150.0–400.0)
RBC: 4.06 Mil/uL (ref 3.87–5.11)
RDW: 13.5 % (ref 11.5–15.5)
WBC: 5.2 10*3/uL (ref 4.0–10.5)

## 2022-06-22 NOTE — Progress Notes (Signed)
HISTORY OF PRESENT ILLNESS:  Holly Kaufman is a 67 y.o. female with a history of GERD complicated by erosive esophagitis and peptic stricture requiring esophageal dilation.  She also has a history of multiple adenomatous colon polyps for which she is under a colonoscopy surveillance program.  She presents today regarding epigastric pain.  Last office visit Jun 09, 2021.  Subsequent colonoscopy and upper endoscopy.  Colonoscopy performed June 21, 2021 revealed an adenomatous polyp.  Follow-up in 5 years recommended.  Her most recent upper endoscopy was January 2024.  She had a benign distal esophageal stricture which was dilated to 20 mm via balloon.  She has continued on omeprazole 40 mg twice daily and famotidine 40 mg at bedtime.  She presents today with chief complaint of epigastric and mid abdominal pain associated with meals.  This has been going on for about 2 months.  Mostly with solids.  Feels like food is moving through slowly.  She states is somewhat different than her classic stricture associated dysphagia.  There has been some nausea but no vomiting.  No change in bowel habits.  She is lost 5 pounds but thinks that this is due to being active.  The discomfort does not bother her at night.  She does take omeprazole 40 mg twice daily and famotidine 40 mg at night.  Reflux symptoms seem to be well-controlled.    REVIEW OF SYSTEMS:  All non-GI ROS negative unless otherwise stated in the HPI except for arthritis, sinus and allergy trouble  Past Medical History:  Diagnosis Date   Acute sinusitis 01/22/2015   Allergy    Anemia    Anxiety    celexa occasionally   Arthritis    hands   Back pain 04/14/2013   Cervical cancer screening 07/10/2008   Qualifier: Diagnosis of  By: Everardo All MD, Sean A    Chicken pox as a child   Colon polyps    adenomatous   Depression with anxiety 02/01/2015   Diverticulosis    Esophageal stricture    GERD (gastroesophageal reflux disease)    [place] measles  as a child   Hiatal hernia    Hyperlipidemia    Patient denies that she has hyperlipidemia.   Internal hemorrhoids    Measles as a child   Mumps as a child   PONV (postoperative nausea and vomiting)    Shingles 67 yrs old   Thyroid disease    hypothyroid   Ulcer     Past Surgical History:  Procedure Laterality Date   COLONOSCOPY     POLYPECTOMY     UPPER GASTROINTESTINAL ENDOSCOPY     VAGINAL HYSTERECTOMY  67 yrs old   partial   WISDOM TOOTH EXTRACTION  67 yrs old    Social History Holly Kaufman  reports that she has never smoked. She has never used smokeless tobacco. She reports current alcohol use. She reports that she does not use drugs.  family history includes CVA in her mother; Diabetes in her maternal grandfather and paternal grandmother; Heart disease in her maternal grandfather and mother; Hyperlipidemia in her mother and sister; Other in her father; Stomach cancer in her paternal uncle; Stroke in her maternal grandmother and paternal grandfather.  Allergies  Allergen Reactions   Prochlorperazine Other (See Comments)    "eyes roll back in my head" Involuntary head jerking/myoclonic spasms   Prochlorperazine Edisylate     "heads rolls back and eyes roll back"-compazine       PHYSICAL EXAMINATION: Vital  signs: BP 124/70   Pulse 67   Ht 5' (1.524 m)   Wt 127 lb (57.6 kg)   BMI 24.80 kg/m   Constitutional: generally well-appearing, no acute distress Psychiatric: alert and oriented x3, cooperative Eyes: extraocular movements intact, anicteric, conjunctiva pink Mouth: oral pharynx moist, no lesions Neck: supple no lymphadenopathy Cardiovascular: heart regular rate and rhythm, no murmur Lungs: clear to auscultation bilaterally Abdomen: soft, nontender, nondistended, no obvious ascites, no peritoneal signs, normal bowel sounds, no organomegaly Rectal: Omitted Extremities: no clubbing or cyanosis.  Trace lower extremity edema bilaterally Skin: no lesions on  visible extremities Neuro: No focal deficits. No asterixis.  ASSESSMENT:  1.  57-month history of postprandial epigastric pain.  Mild weight loss. 2.  History of GERD complicated by peptic stricture.  Most recently dilated January 2024 3.  History of peptic ulcer 4.  History of multiple adenomatous colon polyps.  Surveillance up-to-date 5.  Mild dysphagia  PLAN:  1.  Continue omeprazole and famotidine 2.  Reflux precautions 3.  Schedule upper endoscopy with esophageal dilation to evaluate pain and address dysphagia. 4.  Schedule abdominal ultrasound.  Rule out gallstones 5.  Blood work today including CBC, comprehensive metabolic panel, and serum lipase 6.  If the above unrevealing, then contrast-enhanced CT scan of the abdomen and pelvis with attention to the pancreas to evaluate postprandial pain and weight loss. A total time of 40 minutes was spent preparing to see the patient, obtaining comprehensive history, performing medically appropriate physical exam, counseling educating the patient regarding the above listed issues, ordering blood work, ordering therapeutic endoscopic procedure, ordering advanced radiology study, and documenting clinical information in the health record

## 2022-06-22 NOTE — Patient Instructions (Signed)
Your provider has requested that you go to the basement level for lab work before leaving today. Press "B" on the elevator. The lab is located at the first door on the left as you exit the elevator.  You have been scheduled for an abdominal ultrasound at [facility] (to the right of the elevator on first floor) on 06/30/2022 at 10:00am. Please arrive 15 minutes prior to your appointment for registration. Make certain not to have anything to eat or drink after midnight  prior to your appointment. Should you need to reschedule your appointment, please contact radiology at [phone]. This test typically takes about 30 minutes to perform.  You have been scheduled for an endoscopy. Please follow written instructions given to you at your visit today. If you use inhalers (even only as needed), please bring them with you on the day of your procedure.

## 2022-06-23 LAB — COMPREHENSIVE METABOLIC PANEL
ALT: 10 U/L (ref 0–35)
AST: 21 U/L (ref 0–37)
Albumin: 4.6 g/dL (ref 3.5–5.2)
Alkaline Phosphatase: 76 U/L (ref 39–117)
BUN: 7 mg/dL (ref 6–23)
CO2: 22 mEq/L (ref 19–32)
Calcium: 9.4 mg/dL (ref 8.4–10.5)
Chloride: 100 mEq/L (ref 96–112)
Creatinine, Ser: 0.97 mg/dL (ref 0.40–1.20)
GFR: 60.35 mL/min (ref >60.00)
Glucose, Bld: 83 mg/dL (ref 70–99)
Potassium: 3.7 mEq/L (ref 3.5–5.1)
Sodium: 136 mEq/L (ref 135–145)
Total Bilirubin: 0.5 mg/dL (ref 0.2–1.2)
Total Protein: 7.5 g/dL (ref 6.0–8.3)

## 2022-06-23 LAB — LIPASE: Lipase: 13 U/L (ref 11.0–59.0)

## 2022-06-30 ENCOUNTER — Ambulatory Visit (HOSPITAL_BASED_OUTPATIENT_CLINIC_OR_DEPARTMENT_OTHER)
Admission: RE | Admit: 2022-06-30 | Discharge: 2022-06-30 | Disposition: A | Discharge status: Home / Self Care | Payer: Medicare Other | Source: Ambulatory Visit | Attending: Internal Medicine | Admitting: Internal Medicine

## 2022-06-30 DIAGNOSIS — K2100 Gastro-esophageal reflux disease with esophagitis, without bleeding: Secondary | ICD-10-CM | POA: Diagnosis present

## 2022-06-30 DIAGNOSIS — R1310 Dysphagia, unspecified: Secondary | ICD-10-CM | POA: Diagnosis present

## 2022-06-30 DIAGNOSIS — R1013 Epigastric pain: Secondary | ICD-10-CM | POA: Insufficient documentation

## 2022-06-30 DIAGNOSIS — K222 Esophageal obstruction: Secondary | ICD-10-CM | POA: Insufficient documentation

## 2022-06-30 DIAGNOSIS — Z86010 Personal history of colonic polyps: Secondary | ICD-10-CM | POA: Insufficient documentation

## 2022-07-08 ENCOUNTER — Encounter: Payer: Medicare Other | Admitting: Internal Medicine

## 2022-07-18 ENCOUNTER — Other Ambulatory Visit: Payer: Self-pay | Admitting: Internal Medicine

## 2022-07-19 ENCOUNTER — Encounter: Payer: Self-pay | Admitting: Internal Medicine

## 2022-07-19 ENCOUNTER — Encounter: Payer: Medicare Other | Admitting: Internal Medicine

## 2022-07-19 ENCOUNTER — Ambulatory Visit: Payer: Medicare Other | Admitting: Internal Medicine

## 2022-07-19 VITALS — BP 94/58 | HR 68 | Temp 98.4°F | Resp 16 | Ht 60.0 in | Wt 127.0 lb

## 2022-07-19 DIAGNOSIS — K449 Diaphragmatic hernia without obstruction or gangrene: Secondary | ICD-10-CM | POA: Diagnosis not present

## 2022-07-19 DIAGNOSIS — R1013 Epigastric pain: Secondary | ICD-10-CM

## 2022-07-19 DIAGNOSIS — K222 Esophageal obstruction: Secondary | ICD-10-CM

## 2022-07-19 DIAGNOSIS — K317 Polyp of stomach and duodenum: Secondary | ICD-10-CM | POA: Diagnosis not present

## 2022-07-19 DIAGNOSIS — K2100 Gastro-esophageal reflux disease with esophagitis, without bleeding: Secondary | ICD-10-CM

## 2022-07-19 DIAGNOSIS — R1310 Dysphagia, unspecified: Secondary | ICD-10-CM

## 2022-07-19 MED ORDER — SODIUM CHLORIDE 0.9 % IV SOLN
500.0000 mL | Freq: Once | INTRAVENOUS | Status: DC
Start: 2022-07-19 — End: 2022-07-19

## 2022-07-19 NOTE — Progress Notes (Signed)
Report to PACU, RN, vss, BBS= Clear.  

## 2022-07-19 NOTE — Progress Notes (Signed)
VS by DT  Pt's states no medical or surgical changes since previsit or office visit.  

## 2022-07-19 NOTE — Progress Notes (Signed)
HISTORY OF PRESENT ILLNESS:   Holly Kaufman is a 67 y.o. female with a history of GERD complicated by erosive esophagitis and peptic stricture requiring esophageal dilation.  She also has a history of multiple adenomatous colon polyps for which she is under a colonoscopy surveillance program.  She presents today regarding epigastric pain.   Last office visit Jun 09, 2021.  Subsequent colonoscopy and upper endoscopy.  Colonoscopy performed June 21, 2021 revealed an adenomatous polyp.  Follow-up in 5 years recommended.  Her most recent upper endoscopy was January 2024.  She had a benign distal esophageal stricture which was dilated to 20 mm via balloon.  She has continued on omeprazole 40 mg twice daily and famotidine 40 mg at bedtime.   She presents today with chief complaint of epigastric and mid abdominal pain associated with meals.  This has been going on for about 2 months.  Mostly with solids.  Feels like food is moving through slowly.  She states is somewhat different than her classic stricture associated dysphagia.  There has been some nausea but no vomiting.  No change in bowel habits.  She is lost 5 pounds but thinks that this is due to being active.  The discomfort does not bother her at night.  She does take omeprazole 40 mg twice daily and famotidine 40 mg at night.  Reflux symptoms seem to be well-controlled.       REVIEW OF SYSTEMS:   All non-GI ROS negative unless otherwise stated in the HPI except for arthritis, sinus and allergy trouble       Past Medical History:  Diagnosis Date   Acute sinusitis 01/22/2015   Allergy     Anemia     Anxiety      celexa occasionally   Arthritis      hands   Back pain 04/14/2013   Cervical cancer screening 07/10/2008    Qualifier: Diagnosis of  By: Everardo All MD, Sean A    Chicken pox as a child   Colon polyps      adenomatous   Depression with anxiety 02/01/2015   Diverticulosis     Esophageal stricture     GERD (gastroesophageal reflux  disease)     [place] measles as a child   Hiatal hernia     Hyperlipidemia      Patient denies that she has hyperlipidemia.   Internal hemorrhoids     Measles as a child   Mumps as a child   PONV (postoperative nausea and vomiting)     Shingles 67 yrs old   Thyroid disease      hypothyroid   Ulcer             Past Surgical History:  Procedure Laterality Date   COLONOSCOPY       POLYPECTOMY       UPPER GASTROINTESTINAL ENDOSCOPY       VAGINAL HYSTERECTOMY   67 yrs old    partial   WISDOM TOOTH EXTRACTION   67 yrs old      Social History SUZON BROOKER  reports that she has never smoked. She has never used smokeless tobacco. She reports current alcohol use. She reports that she does not use drugs.   family history includes CVA in her mother; Diabetes in her maternal grandfather and paternal grandmother; Heart disease in her maternal grandfather and mother; Hyperlipidemia in her mother and sister; Other in her father; Stomach cancer in her paternal uncle; Stroke in her maternal grandmother  and paternal grandfather.        Allergies  Allergen Reactions   Prochlorperazine Other (See Comments)      "eyes roll back in my head" Involuntary head jerking/myoclonic spasms   Prochlorperazine Edisylate        "heads rolls back and eyes roll back"-compazine          PHYSICAL EXAMINATION: Vital signs: BP 124/70   Pulse 67   Ht 5' (1.524 m)   Wt 127 lb (57.6 kg)   BMI 24.80 kg/m   Constitutional: generally well-appearing, no acute distress Psychiatric: alert and oriented x3, cooperative Eyes: extraocular movements intact, anicteric, conjunctiva pink Mouth: oral pharynx moist, no lesions Neck: supple no lymphadenopathy Cardiovascular: heart regular rate and rhythm, no murmur Lungs: clear to auscultation bilaterally Abdomen: soft, nontender, nondistended, no obvious ascites, no peritoneal signs, normal bowel sounds, no organomegaly Rectal: Omitted Extremities: no clubbing or  cyanosis.  Trace lower extremity edema bilaterally Skin: no lesions on visible extremities Neuro: No focal deficits. No asterixis.   ASSESSMENT:   1.  1-month history of postprandial epigastric pain.  Mild weight loss. 2.  History of GERD complicated by peptic stricture.  Most recently dilated January 2024 3.  History of peptic ulcer 4.  History of multiple adenomatous colon polyps.  Surveillance up-to-date 5.  Mild dysphagia   PLAN:   1.  Continue omeprazole and famotidine 2.  Reflux precautions 3.  Schedule upper endoscopy with esophageal dilation to evaluate pain and address dysphagia. 4.  Schedule abdominal ultrasound.  Rule out gallstones 5.  Blood work today including CBC, comprehensive metabolic panel, and serum lipase 6.  If the above unrevealing, then contrast-enhanced CT scan of the abdomen and pelvis with attention to the pancreas to evaluate postprandial pain and weight loss.   Blood work and ultrasound were unremarkable.  Now for upper endoscopy

## 2022-07-19 NOTE — Progress Notes (Signed)
Called to room to assist during endoscopic procedure.  Patient ID and intended procedure confirmed with present staff. Received instructions for my participation in the procedure from the performing physician.  

## 2022-07-19 NOTE — Op Note (Signed)
[facility] Patient Name: Holly Kaufman Procedure Date: 07/19/2022 1:21 PM MRN: [id number] Endoscopist: Wilhemina Bonito. Marina Goodell , MD, [id number] Age: 67 Referring MD:  Date of Birth: 12-23-1955 Gender: Female Account #: [id number] Procedure:                Upper GI endoscopy with balloon dilation of the                            esophagus?"20 mm max; biopsies Indications:              Epigastric abdominal pain, Dysphagia, Esophageal                            reflux Medicines:                Monitored Anesthesia Care Procedure:                Pre-Anesthesia Assessment:                           - Prior to the procedure, a History and Physical                            was performed, and patient medications and                            allergies were reviewed. The patient's tolerance of                            previous anesthesia was also reviewed. The risks                            and benefits of the procedure and the sedation                            options and risks were discussed with the patient.                            All questions were answered, and informed consent                            was obtained. Prior Anticoagulants: The patient has                            taken no anticoagulant or antiplatelet agents.                            After reviewing the risks and benefits, the patient                            was deemed in satisfactory condition to undergo the                            procedure.  After obtaining informed consent, the endoscope was                            passed under direct vision. Throughout the                            procedure, the patient's blood pressure, pulse, and                            oxygen saturations were monitored continuously. The                            GIF [id number] #[id number] was introduced through the                            mouth, and advanced to the second part of duodenum.                             The upper GI endoscopy was accomplished without                            difficulty. The patient tolerated the procedure                            well. Scope In: Scope Out: Findings:                 The esophagus was foreshortened. There was a 15 mm                            in diameter benign ringlike stricture at the                            gastroesophageal junction (30 cm from the                            incisors). A TTS dilator was passed through the                            scope. Dilation with an 18-19-20 mm balloon dilator                            was performed to 20 mm. There was no trauma post                            dilation.                           The stomach revealed a large hiatal hernia. A few                            benign fundic gland type polyps noted. Stomach was  otherwise normal.                           The examined duodenum was normal. Biopsies for                            histology were taken with a cold forceps for                            evaluation of celiac disease.                           The cardia and gastric fundus were normal on                            retroflexion. Complications:            No immediate complications. Estimated Blood Loss:     Estimated blood loss: none. Impression:               1. Esophageal stricture status post dilation                           2. Large hiatal hernia                           3. Duodenal biopsies. Recommendation:           - Patient has a contact number available for                            emergencies. The signs and symptoms of potential                            delayed complications were discussed with the                            patient. Return to normal activities tomorrow.                            Written discharge instructions were provided to the                            patient.                           - Post  dilation diet.                           - Continue present medications.                           - Await pathology results.                           - PLEASE SCHEDULE a contrast-enhanced CT scan of  the abdomen and pelvis "epigastric pain and weight                            loss, evaluate for cause"                           - Office follow-up with Dr. Marina Goodell in 4 to 6 weeks Wilhemina Bonito. Marina Goodell, MD 07/19/2022 1:54:10 PM This report has been signed electronically.

## 2022-07-19 NOTE — Patient Instructions (Addendum)
Await pathology results.  Post dilation diet (Instructions given)  Dr. Lamar Sprinkles nurse will call you regarding CT scan and a follow up appointment with MD    YOU HAD AN ENDOSCOPIC PROCEDURE TODAY AT THE [facility]:   Refer to the procedure report that was given to you for any specific questions about what was found during the examination.  If the procedure report does not answer your questions, please call your gastroenterologist to clarify.  If you requested that your care partner not be given the details of your procedure findings, then the procedure report has been included in a sealed envelope for you to review at your convenience later.  YOU SHOULD EXPECT: Some feelings of bloating in the abdomen. Passage of more gas than usual.  Walking can help get rid of the air that was put into your GI tract during the procedure and reduce the bloating. If you had a lower endoscopy (such as a colonoscopy or flexible sigmoidoscopy) you may notice spotting of blood in your stool or on the toilet paper. If you underwent a bowel prep for your procedure, you may not have a normal bowel movement for a few days.  Please Note:  You might notice some irritation and congestion in your nose or some drainage.  This is from the oxygen used during your procedure.  There is no need for concern and it should clear up in a day or so.  SYMPTOMS TO REPORT IMMEDIATELY:   Following upper endoscopy (EGD)  Vomiting of blood or coffee ground material  New chest pain or pain under the shoulder blades  Painful or persistently difficult swallowing  New shortness of breath  Fever of 100F or higher  Black, tarry-looking stools  For urgent or emergent issues, a gastroenterologist can be reached at any hour by calling [phone]. Do not use MyChart messaging for urgent concerns.    DIET:  Post dilation diet (see attached sheet).You may  proceed to your regular diet tomorrow morning.  Drink plenty of fluids  but you should avoid alcoholic beverages for 24 hours.  ACTIVITY:  You should plan to take it easy for the rest of today and you should NOT DRIVE or use heavy machinery until tomorrow (because of the sedation medicines used during the test).    FOLLOW UP: Our staff will call the number listed on your records the next business day following your procedure.  We will call around 7:15- 8:00 am to check on you and address any questions or concerns that you may have regarding the information given to you following your procedure. If we do not reach you, we will leave a message.     If any biopsies were taken you will be contacted by phone or by letter within the next 1-3 weeks.  Please call us at [phone] if you have not heard about the biopsies in 3 weeks.    SIGNATURES/CONFIDENTIALITY: You and/or your care partner have signed paperwork which will be entered into your electronic medical record.  These signatures attest to the fact that that the information above on your After Visit Summary has been reviewed and is understood.  Full responsibility of the confidentiality of this discharge information lies with you and/or your care-partner.

## 2022-07-20 ENCOUNTER — Telehealth: Payer: Self-pay | Admitting: Internal Medicine

## 2022-07-20 ENCOUNTER — Telehealth: Payer: Self-pay

## 2022-07-20 ENCOUNTER — Telehealth: Payer: Self-pay | Admitting: *Deleted

## 2022-07-20 DIAGNOSIS — R1013 Epigastric pain: Secondary | ICD-10-CM

## 2022-07-20 DIAGNOSIS — R634 Abnormal weight loss: Secondary | ICD-10-CM

## 2022-07-20 NOTE — Telephone Encounter (Signed)
  Follow up Call-     07/19/2022   12:56 PM 02/11/2022    9:16 AM 06/21/2021    7:05 AM  Call back number  Post procedure Call Back phone  # [phone]  Permission to leave phone message Yes Yes Yes     Patient questions:  Do you have a fever, pain , or abdominal swelling? No. Pain Score  0 *  Have you tolerated food without any problems? Yes.    Have you been able to return to your normal activities? Yes.    Do you have any questions about your discharge instructions: Diet   No. Medications  No. Follow up visit  No.  Do you have questions or concerns about your Care? No.  Actions: * If pain score is 4 or above: No action needed, pain <4.

## 2022-07-20 NOTE — Telephone Encounter (Signed)
Inbound call from patient returning phone call. Please advise, thank you.  

## 2022-07-20 NOTE — Telephone Encounter (Signed)
Per Dr Marina Goodell a CT abdomin and pelvis with contrast has been set up for 07/29/2022 at [facility]. Arrive at 2:45pm for a 3:00pm scan. Patient informed of date/time. No longer NPO per scheduling.

## 2022-07-20 NOTE — Telephone Encounter (Signed)
I spoke with Joyce Gross and informed her of her upcoming appointments.

## 2022-07-26 ENCOUNTER — Encounter: Payer: Self-pay | Admitting: Internal Medicine

## 2022-07-29 ENCOUNTER — Ambulatory Visit (HOSPITAL_BASED_OUTPATIENT_CLINIC_OR_DEPARTMENT_OTHER)
Admission: RE | Admit: 2022-07-29 | Discharge: 2022-07-29 | Disposition: A | Discharge status: Home / Self Care | Payer: Medicare Other | Source: Ambulatory Visit | Attending: Internal Medicine | Admitting: Internal Medicine

## 2022-07-29 ENCOUNTER — Other Ambulatory Visit (HOSPITAL_BASED_OUTPATIENT_CLINIC_OR_DEPARTMENT_OTHER): Payer: Self-pay | Admitting: Family Medicine

## 2022-07-29 DIAGNOSIS — R1013 Epigastric pain: Secondary | ICD-10-CM | POA: Diagnosis present

## 2022-07-29 DIAGNOSIS — R634 Abnormal weight loss: Secondary | ICD-10-CM | POA: Insufficient documentation

## 2022-07-29 DIAGNOSIS — Z1231 Encounter for screening mammogram for malignant neoplasm of breast: Secondary | ICD-10-CM

## 2022-07-29 MED ORDER — IOHEXOL 300 MG/ML  SOLN
100.0000 mL | Freq: Once | INTRAMUSCULAR | Status: AC | PRN
  Administered 2022-07-29: 100 mL via INTRAVENOUS

## 2022-08-09 ENCOUNTER — Ambulatory Visit (HOSPITAL_BASED_OUTPATIENT_CLINIC_OR_DEPARTMENT_OTHER)
Admission: RE | Admit: 2022-08-09 | Discharge: 2022-08-09 | Disposition: A | Discharge status: Home / Self Care | Payer: Medicare Other | Source: Ambulatory Visit | Attending: Family Medicine | Admitting: Family Medicine

## 2022-08-09 ENCOUNTER — Encounter (HOSPITAL_BASED_OUTPATIENT_CLINIC_OR_DEPARTMENT_OTHER): Payer: Self-pay

## 2022-08-09 DIAGNOSIS — Z1231 Encounter for screening mammogram for malignant neoplasm of breast: Secondary | ICD-10-CM | POA: Diagnosis present

## 2022-08-24 ENCOUNTER — Ambulatory Visit: Payer: Medicare Other | Admitting: Internal Medicine

## 2022-08-24 ENCOUNTER — Encounter: Payer: Self-pay | Admitting: Internal Medicine

## 2022-08-24 VITALS — BP 106/70 | HR 67 | Ht 61.0 in | Wt 124.0 lb

## 2022-08-24 DIAGNOSIS — Z86010 Personal history of colonic polyps: Secondary | ICD-10-CM

## 2022-08-24 DIAGNOSIS — K449 Diaphragmatic hernia without obstruction or gangrene: Secondary | ICD-10-CM | POA: Diagnosis not present

## 2022-08-24 DIAGNOSIS — Z8719 Personal history of other diseases of the digestive system: Secondary | ICD-10-CM

## 2022-08-24 DIAGNOSIS — R1310 Dysphagia, unspecified: Secondary | ICD-10-CM

## 2022-08-24 DIAGNOSIS — R1013 Epigastric pain: Secondary | ICD-10-CM

## 2022-08-24 DIAGNOSIS — R634 Abnormal weight loss: Secondary | ICD-10-CM

## 2022-08-24 DIAGNOSIS — Z8711 Personal history of peptic ulcer disease: Secondary | ICD-10-CM

## 2022-08-24 DIAGNOSIS — K219 Gastro-esophageal reflux disease without esophagitis: Secondary | ICD-10-CM | POA: Diagnosis not present

## 2022-08-24 DIAGNOSIS — K222 Esophageal obstruction: Secondary | ICD-10-CM

## 2022-08-24 DIAGNOSIS — K2100 Gastro-esophageal reflux disease with esophagitis, without bleeding: Secondary | ICD-10-CM

## 2022-08-24 NOTE — Patient Instructions (Signed)
You have been scheduled for an Upper GI Series at [facility]. Your appointment is on 09/01/2022 at 9:00am. Please arrive 15 minutes prior to your test for registration. Make sure not to eat or drink anything after midnight on the night before your test. If you need to reschedule, please call radiology at [phone]. ________________________________________________________________ An upper GI series uses x rays to help diagnose problems of the upper GI tract, which includes the esophagus, stomach, and duodenum. The duodenum is the first part of the small intestine. An upper GI series is conducted by a radiology technologist or a radiologist--a doctor who specializes in x-ray imaging--at a hospital or outpatient center. While sitting or standing in front of an x-ray machine, the patient drinks barium liquid, which is often white and has a chalky consistency and taste. The barium liquid coats the lining of the upper GI tract and makes signs of disease show up more clearly on x rays. X-ray video, called fluoroscopy, is used to view the barium liquid moving through the esophagus, stomach, and duodenum. Additional x rays and fluoroscopy are performed while the patient lies on an x-ray table. To fully coat the upper GI tract with barium liquid, the technologist or radiologist may press on the abdomen or ask the patient to change position. Patients hold still in various positions, allowing the technologist or radiologist to take x rays of the upper GI tract at different angles. If a technologist conducts the upper GI series, a radiologist will later examine the images to look for problems.  This test typically takes about 1 hour to complete. _________________________________________________________  I will send a referral to [facility] for a surgery consultation

## 2022-08-24 NOTE — Progress Notes (Signed)
HISTORY OF PRESENT ILLNESS:  Holly Kaufman is a 67 y.o. female with a history of GERD complicated by erosive esophagitis and peptic stricture requiring esophageal dilation, and multiple adenomatous colon polyps for which she is under surveillance.  She presents today for follow-up.  The patient was last seen in this office June 22, 2018 for regarding a 30-month history of postprandial epigastric pain with mild weight loss.  Also mild dysphagia.  See that dictation.  She subsequently underwent abdominal ultrasound June 30, 2022.  This was normal.  On July 19, 2022 she underwent upper endoscopy.  She was found to have a distal esophageal stricture which was dilated to 20 mm.  In addition, a large hiatal hernia.  Duodenal biopsies were normal.  Finally, she underwent a contrast-enhanced CT scan of the abdomen pelvis.  She was noted to have the large hiatal hernia with near complete intrathoracic position of the stomach.  Not follow-up.  Patient tells me that she is feeling the same since her endoscopy.  She describes a sensation of fullness and pressure while eating which resulted in cessation of eating.  She might try carbonated beverage in order to belch.  Belching helps.  No new complaints.  She continues on twice daily omeprazole 40 mg for her GERD.  REVIEW OF SYSTEMS:  All non-GI ROS negative unless otherwise stated in the HPI except for sinus and allergy, arthritis  Past Medical History:  Diagnosis Date   Acute sinusitis 01/22/2015   Allergy    Anemia    Anxiety    celexa occasionally   Arthritis    hands   Back pain 04/14/2013   Cervical cancer screening 07/10/2008   Qualifier: Diagnosis of  By: Everardo All MD, Sean A    Chicken pox as a child   Colon polyps    adenomatous   Depression with anxiety 02/01/2015   Diverticulosis    Esophageal stricture    GERD (gastroesophageal reflux disease)    [place] measles as a child   Hiatal hernia    Hyperlipidemia    Patient denies that she has  hyperlipidemia.   Internal hemorrhoids    Measles as a child   Mumps as a child   PONV (postoperative nausea and vomiting)    Shingles 67 yrs old   Thyroid disease    hypothyroid   Ulcer     Past Surgical History:  Procedure Laterality Date   COLONOSCOPY     POLYPECTOMY     UPPER GASTROINTESTINAL ENDOSCOPY     VAGINAL HYSTERECTOMY  67 yrs old   partial   WISDOM TOOTH EXTRACTION  67 yrs old    Social History DEIRA SEISS  reports that she has never smoked. She has never used smokeless tobacco. She reports current alcohol use. She reports that she does not use drugs.  family history includes CVA in her mother; Diabetes in her maternal grandfather and paternal grandmother; Heart disease in her maternal grandfather and mother; Hyperlipidemia in her mother and sister; Other in her father; Stomach cancer in her paternal uncle; Stroke in her maternal grandmother and paternal grandfather.  Allergies  Allergen Reactions   Prochlorperazine Other (See Comments)    "eyes roll back in my head" Involuntary head jerking/myoclonic spasms   Prochlorperazine Edisylate     "heads rolls back and eyes roll back"-compazine       PHYSICAL EXAMINATION: Vital signs: Ht 5\' 1"  (1.549 m)   Wt 124 lb (56.2 kg)   BMI 23.43 kg/m  Constitutional: generally well-appearing, no acute distress Psychiatric: alert and oriented x3, cooperative Eyes: Anicteric Abdomen: Not reexamined Extremities: no clubbing, cyanosis, or lower extremity edema bilaterally Skin: no relevant lesions on visible extremities Neuro: Grossly intact  ASSESSMENT:  1.  Large symptomatic hiatal hernia 2.  GERD with a history of erosive esophagitis and peptic stricture.  On twice daily PPI.  Recent endoscopy without esophagitis.  Stricture dilated. 3.  History of peptic ulcer remotely 4.  History of multiple adenomatous colon polyps.  Surveillance up-to-date   PLAN:  1.  Upper GI series to define the anatomy 2.  General  Surgical referral to Dr. Alphonzo Dublin at [facility] for opinion regarding hernia repair. 3.  Reflux precautions 4.  Continue PPI 5.  Surveillance colonoscopy around June 2028 A total time of 30 minutes spent preparing to see the patient, obtaining interval history, performing medically appropriate physical examination, counseling and educating the patient regarding the above listed issues, ordering radiology study, initiating surgical referral, and documenting clinical information in the health record.

## 2022-09-01 ENCOUNTER — Ambulatory Visit (HOSPITAL_COMMUNITY)
Admission: RE | Admit: 2022-09-01 | Discharge: 2022-09-01 | Disposition: A | Discharge status: Home / Self Care | Payer: Medicare Other | Source: Ambulatory Visit | Attending: Internal Medicine | Admitting: Internal Medicine

## 2022-09-01 ENCOUNTER — Other Ambulatory Visit: Payer: Self-pay | Admitting: Internal Medicine

## 2022-09-01 DIAGNOSIS — R1013 Epigastric pain: Secondary | ICD-10-CM | POA: Insufficient documentation

## 2022-09-01 DIAGNOSIS — K2100 Gastro-esophageal reflux disease with esophagitis, without bleeding: Secondary | ICD-10-CM | POA: Diagnosis present

## 2022-09-01 DIAGNOSIS — R1310 Dysphagia, unspecified: Secondary | ICD-10-CM

## 2022-09-01 DIAGNOSIS — R634 Abnormal weight loss: Secondary | ICD-10-CM | POA: Insufficient documentation

## 2022-09-01 DIAGNOSIS — K222 Esophageal obstruction: Secondary | ICD-10-CM | POA: Insufficient documentation

## 2022-09-02 ENCOUNTER — Other Ambulatory Visit: Payer: Self-pay

## 2022-09-02 DIAGNOSIS — K449 Diaphragmatic hernia without obstruction or gangrene: Secondary | ICD-10-CM

## 2022-09-26 ENCOUNTER — Telehealth: Payer: Self-pay | Admitting: Internal Medicine

## 2022-09-26 ENCOUNTER — Encounter: Payer: Self-pay | Admitting: Internal Medicine

## 2022-09-26 NOTE — Telephone Encounter (Signed)
Inbound call from patient requesting referral sent to Dr Judi Cong at [facility] number [phone]  Please advise. Thank you

## 2022-09-27 ENCOUNTER — Telehealth: Payer: Self-pay

## 2022-09-27 NOTE — Telephone Encounter (Signed)
Pt saw Dr. Charlyn Minerva at [facility] yesterday but still wants to see Dr. Kela Millin regarding her surgery instead.  I spoke to [place] in his office and she is doing to call the patient about an appointment.  I talked to [place] in imaging and she is going to push the images from patient's upper gi series and ct to [facility].

## 2022-09-27 NOTE — Telephone Encounter (Signed)
Faxed appropriate records to Dr. Algis Downs office and lm on his office vm that I wanted to refer patient

## 2023-04-13 IMAGING — MG MM DIGITAL SCREENING BILAT W/ TOMO AND CAD
6 of 10 series · 6 of 30 positions shown · non-contrast
Comparison: Previous exam(s).

CLINICAL DATA: Screening.

EXAM:
DIGITAL SCREENING BILATERAL MAMMOGRAM WITH TOMOSYNTHESIS AND CAD
TECHNIQUE: Bilateral screening digital craniocaudal and mediolateral oblique
mammograms were obtained. Bilateral screening digital breast
tomosynthesis was performed. The images were evaluated with
computer-aided detection.

[R CC synth-2D]
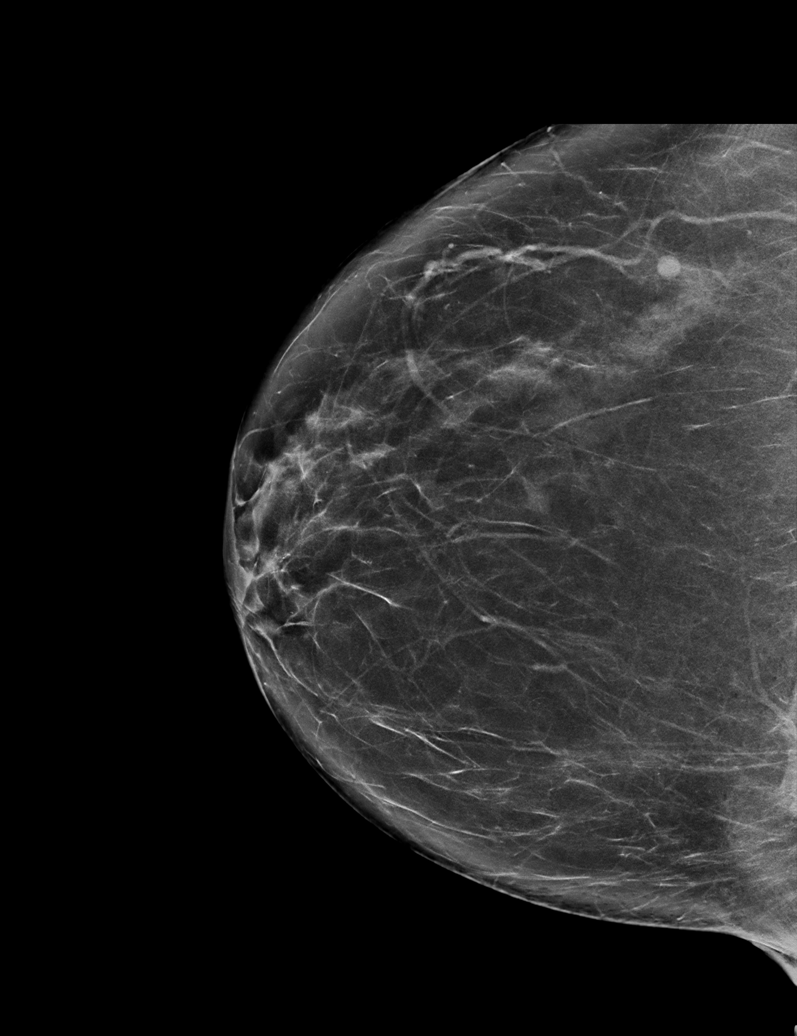

[R MLO synth-2D (1 of 2)]
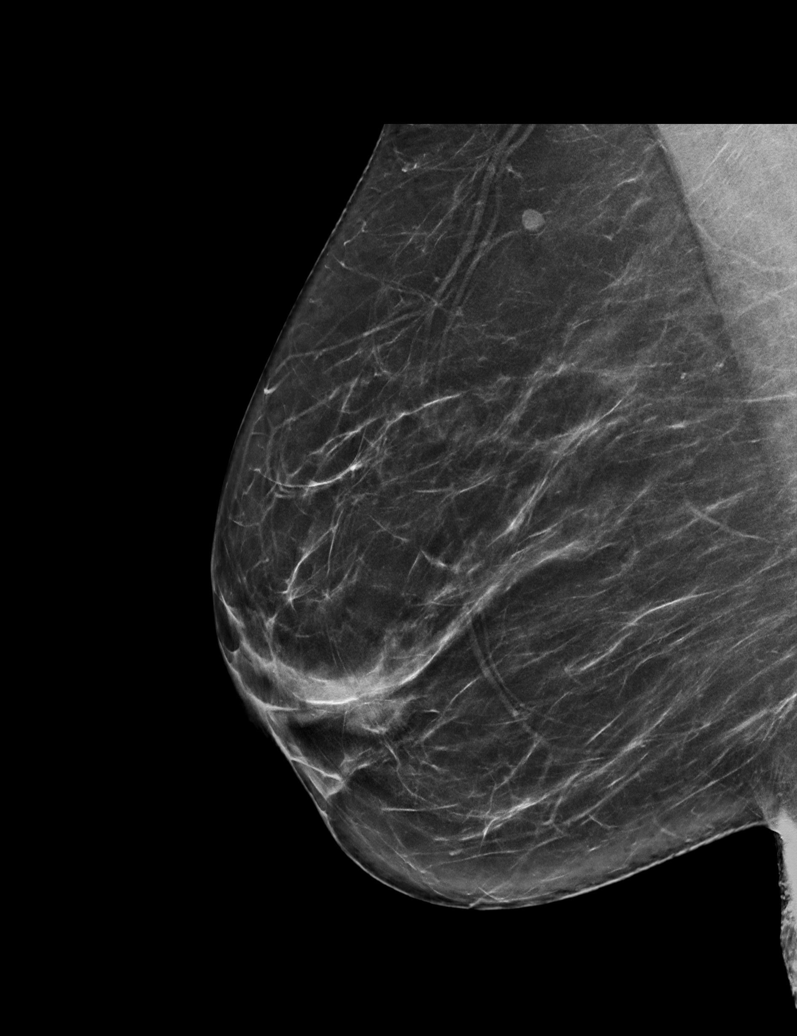

[L CC synth-2D]
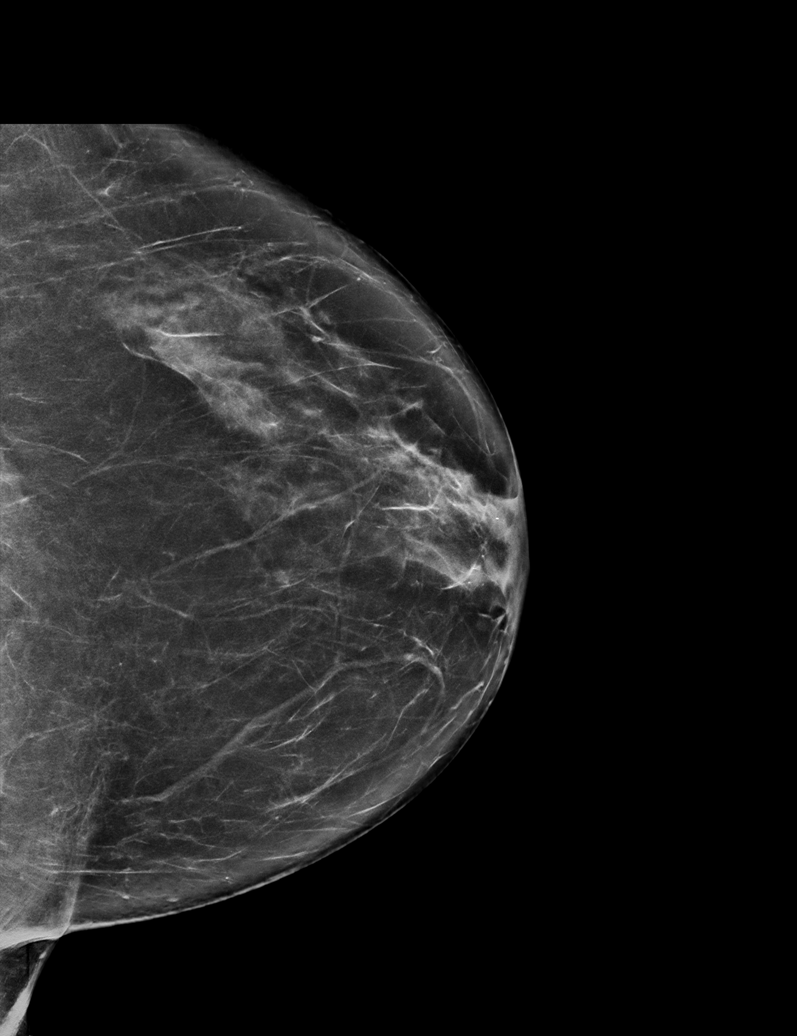

[L MLO synth-2D]
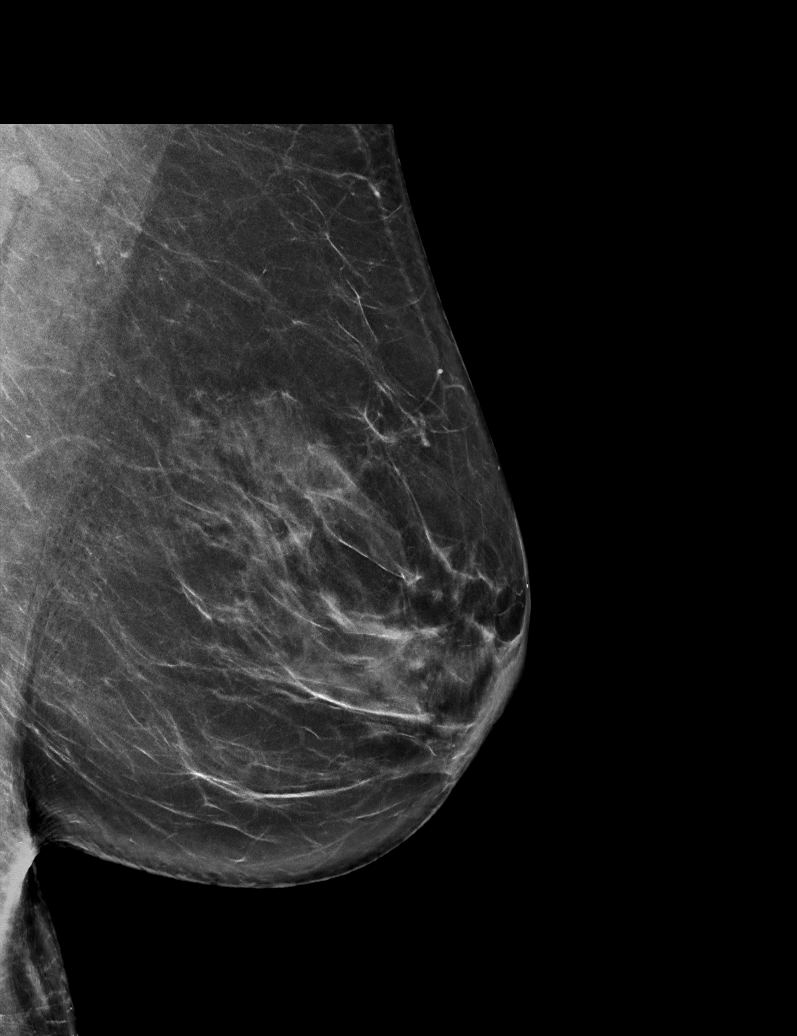

[R MLO synth-2D (2 of 2)]
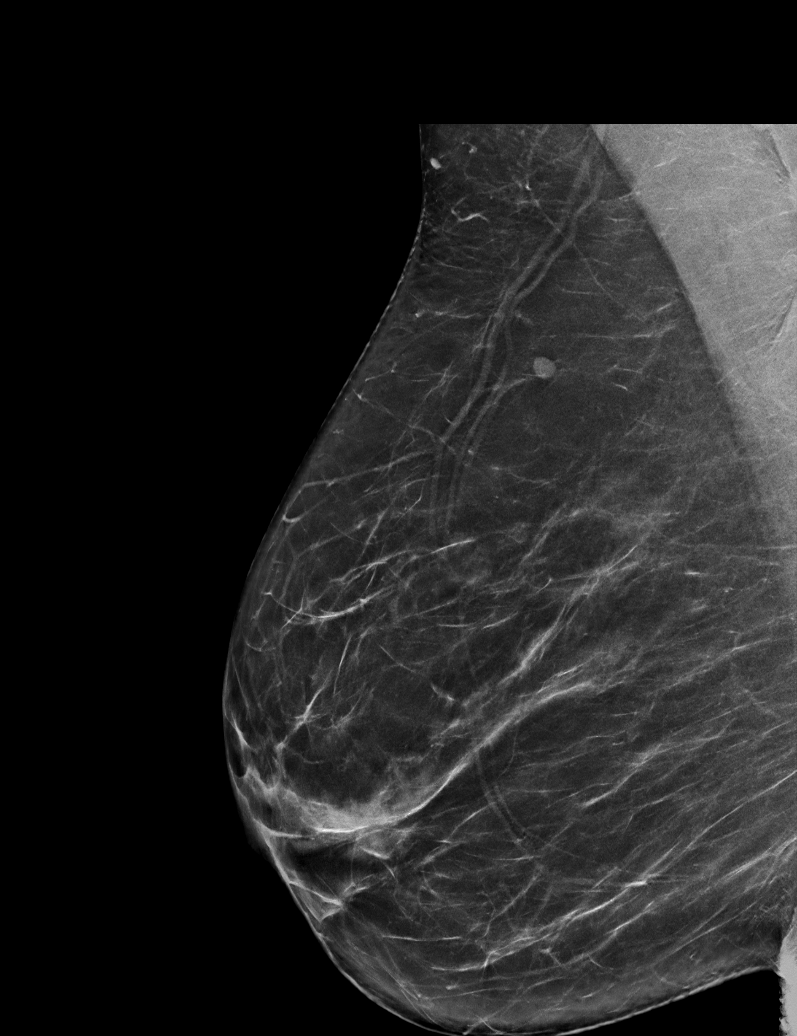

[R CC tomo · tomo slice 36/71.0]
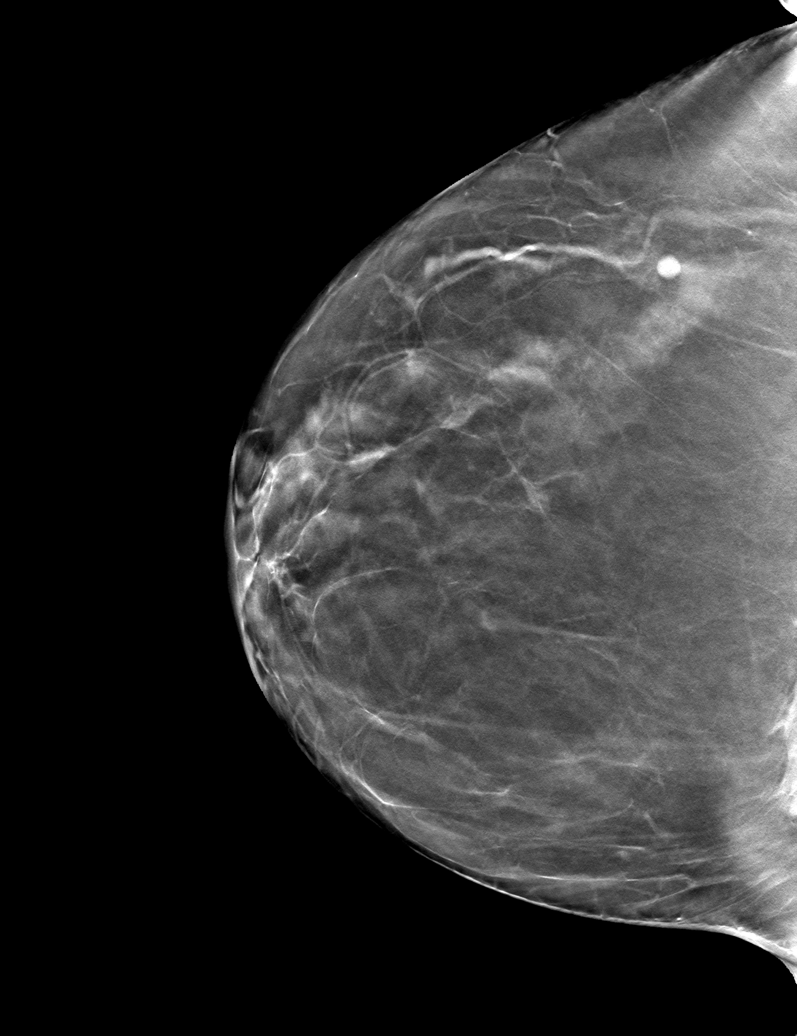

[6 of 30 positions shown; findings below may reference images not displayed]

ACR Breast Density Category c: The breast tissue is heterogeneously
dense, which may obscure small masses.
FINDINGS: There are no findings suspicious for malignancy. The images were
evaluated with computer-aided detection.
IMPRESSION: No mammographic evidence of malignancy. A result letter of this
screening mammogram will be mailed directly to the patient.

RECOMMENDATION:
Screening mammogram in one year. (Code:T4-5-GWO)

BI-RADS CATEGORY  1: Negative.

## 2023-05-23 ENCOUNTER — Encounter (INDEPENDENT_AMBULATORY_CARE_PROVIDER_SITE_OTHER): Payer: Medicare Other | Admitting: Ophthalmology
